# Patient Record
Sex: Female | Born: 1990 | Race: Black or African American | Hispanic: No | Marital: Married | Smoking: Former smoker
Health system: Southern US, Community
[De-identification: ages and names within clinical notes are randomized; demographics above are authoritative.]

## PROBLEM LIST (undated history)

## (undated) DIAGNOSIS — D696 Thrombocytopenia, unspecified: Secondary | ICD-10-CM

## (undated) DIAGNOSIS — Z9289 Personal history of other medical treatment: Secondary | ICD-10-CM

## (undated) DIAGNOSIS — R87619 Unspecified abnormal cytological findings in specimens from cervix uteri: Secondary | ICD-10-CM

## (undated) DIAGNOSIS — N390 Urinary tract infection, site not specified: Secondary | ICD-10-CM

## (undated) DIAGNOSIS — F5089 Other specified eating disorder: Secondary | ICD-10-CM

## (undated) HISTORY — DX: Unspecified abnormal cytological findings in specimens from cervix uteri: R87.619

## (undated) HISTORY — DX: Urinary tract infection, site not specified: N39.0

## (undated) HISTORY — DX: Personal history of other medical treatment: Z92.89

## (undated) HISTORY — PX: COLPOSCOPY: SHX161

## (undated) HISTORY — DX: Other specified eating disorder: F50.89

## (undated) HISTORY — DX: Thrombocytopenia, unspecified: D69.6

---
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
## 2008-10-30 ENCOUNTER — Emergency Department: Payer: Self-pay | Admitting: Emergency Medicine

## 2009-02-20 ENCOUNTER — Emergency Department: Payer: Self-pay | Admitting: Emergency Medicine

## 2009-07-15 ENCOUNTER — Emergency Department: Payer: Self-pay | Admitting: Emergency Medicine

## 2009-09-16 ENCOUNTER — Encounter: Payer: Self-pay | Admitting: Obstetrics and Gynecology

## 2010-03-04 ENCOUNTER — Inpatient Hospital Stay: Payer: Self-pay | Admitting: Obstetrics & Gynecology

## 2010-12-21 ENCOUNTER — Emergency Department: Payer: Self-pay | Admitting: Emergency Medicine

## 2010-12-22 ENCOUNTER — Ambulatory Visit: Payer: Self-pay | Admitting: Emergency Medicine

## 2011-04-01 LAB — HM PAP SMEAR: HM Pap smear: NEGATIVE

## 2011-07-23 ENCOUNTER — Encounter: Payer: Self-pay | Admitting: Maternal and Fetal Medicine

## 2011-08-20 ENCOUNTER — Inpatient Hospital Stay: Payer: Self-pay | Admitting: Obstetrics and Gynecology

## 2011-08-20 LAB — CBC WITH DIFFERENTIAL/PLATELET
Basophil #: 0 10*3/uL (ref 0.0–0.1)
Basophil %: 0.3 %
Eosinophil #: 0.1 10*3/uL (ref 0.0–0.7)
Eosinophil %: 0.5 %
HCT: 32.5 % — ABNORMAL LOW (ref 35.0–47.0)
HGB: 10.9 g/dL — ABNORMAL LOW (ref 12.0–16.0)
Lymphocyte %: 21.6 %
MCHC: 33.7 g/dL (ref 32.0–36.0)
Monocyte #: 0.6 10*3/uL (ref 0.0–0.7)
Monocyte %: 5.6 %
Neutrophil #: 7.2 10*3/uL — ABNORMAL HIGH (ref 1.4–6.5)
Neutrophil %: 72 %
WBC: 10 10*3/uL (ref 3.6–11.0)

## 2014-06-14 ENCOUNTER — Encounter: Payer: Self-pay | Admitting: Obstetrics and Gynecology

## 2014-06-29 NOTE — L&D Delivery Note (Addendum)
Delivery Note At 3:10 PM a viable female was delivered via Vaginal, Spontaneous Delivery (Presentation: Right Occiput Anterior).   APGAR: 9, 9 Weight- 8 # 6 Oz. 3810 g   Infant's name: Anette Riedel    Placenta status: Intact, Spontaneous.   Cord: 3 vessels with the following complications: None.  Anesthesia: None  Episiotomy: None Lacerations: None Est. Blood Loss (mL): 200  Mom to postpartum.  Baby to Couplet care / Skin to Skin.  SVD of a live viable female over an intact perineum after a rapid second stage that resulted in crowning and delivery in 2 pushes. After delivery, the infant was placed on the maternal abdomen where the cord was clamped and cut by the fob after pulsation ceased. Apgars were 9/9. The placenta was then delivered via schultz mechanism and was intact with a 3-vessel cord. Pitocin was then started and the uterus was firm with fundal massage and had minimal bleeding. EBL was ~200. The perineum was inspected and was found to be intact. Mother and baby are bonding well.   Jannet Mantis 12/21/2014, 3:29 PM

## 2014-07-15 ENCOUNTER — Emergency Department: Payer: Self-pay | Admitting: Emergency Medicine

## 2014-07-15 LAB — URINALYSIS, COMPLETE
BACTERIA: NONE SEEN
BILIRUBIN, UR: NEGATIVE
Blood: NEGATIVE
Glucose,UR: NEGATIVE mg/dL (ref 0–75)
LEUKOCYTE ESTERASE: NEGATIVE
NITRITE: NEGATIVE
Ph: 9 (ref 4.5–8.0)
Protein: NEGATIVE
RBC,UR: 1 /HPF (ref 0–5)
Specific Gravity: 1.015 (ref 1.003–1.030)
Squamous Epithelial: 2
WBC UR: 1 /HPF (ref 0–5)

## 2014-07-15 LAB — CBC
HCT: 39.5 % (ref 35.0–47.0)
HGB: 13.5 g/dL (ref 12.0–16.0)
MCH: 31.9 pg (ref 26.0–34.0)
MCHC: 34.1 g/dL (ref 32.0–36.0)
MCV: 94 fL (ref 80–100)
PLATELETS: 128 10*3/uL — AB (ref 150–440)
RBC: 4.22 10*6/uL (ref 3.80–5.20)
RDW: 13.9 % (ref 11.5–14.5)
WBC: 7.8 10*3/uL (ref 3.6–11.0)

## 2014-07-15 LAB — BASIC METABOLIC PANEL
Anion Gap: 8 (ref 7–16)
BUN: 4 mg/dL — AB (ref 7–18)
Calcium, Total: 8.1 mg/dL — ABNORMAL LOW (ref 8.5–10.1)
Chloride: 105 mmol/L (ref 98–107)
Co2: 24 mmol/L (ref 21–32)
Creatinine: 0.63 mg/dL (ref 0.60–1.30)
GLUCOSE: 74 mg/dL (ref 65–99)
OSMOLALITY: 269 (ref 275–301)
Potassium: 4 mmol/L (ref 3.5–5.1)
SODIUM: 137 mmol/L (ref 136–145)

## 2014-07-15 LAB — HCG, QUANTITATIVE, PREGNANCY: BETA HCG, QUANT.: 12107 m[IU]/mL — AB

## 2014-07-29 ENCOUNTER — Encounter
Admit: 2014-07-29 | Disposition: A | Discharge status: Home / Self Care | Payer: Self-pay | Admitting: Obstetrics and Gynecology

## 2014-10-21 NOTE — Consult Note (Signed)
Referral Information:   Reason for Referral Thrombocytopenia    Referring Physician Farrel Connersolleen Gutierrez, CNM    Prenatal Hx 24 year old G3P0202 at 2631 1/7 weeks. Previous uncomplicated antenatal course. Platelet count on 06/16/2011 was 102,000. Before this, patient without history of thrombocytopenia    Past Obstetrical Hx 2009 -- 36 week NSVD of a 6#15oz female 2011 -- 336 week NSVD of a 7#15oz female   Home Medications:  Prenatal Multivitamins oral tablet: 1 tab(s) orally once a day, Active  Allergies:   No Known Allergies:   Vital Signs/Notes:  Nursing Vital Signs: **Vital Signs.:   24-Jan-13 09:27   Vital Signs Type Routine   Temperature Temperature (F) 97.2   Celsius 36.2   Temperature Source oral   Pulse Pulse 90   Pulse source per Dinamap   Respirations Respirations 14   Systolic BP Systolic BP 109   Diastolic BP (mmHg) Diastolic BP (mmHg) 63   Mean BP 78   BP Source Dinamap   Perinatal Consult:   Past Medical History cont'd Surgery -- none Illness -- none Allergies -- none Immunizations -- need influenza vaccine  Soc Hx School -- [employer] Occupation -- Futures traderHomemaker Married x 2 years Smoking -- none EtOH -- none Drugs -- none   Impression/Recommendations:   Impression 1) IUP at 31 1/7 weeks 2) Mild Thrombocytopenia   a) Most likely Gestational Thrombocytopenia   b) Possible (less likely) post viral   c) Possible (less likely) ITP    Recommendations 1) Recheck platelets at 36 weeks' gestation 2) If below value that your anesthesiologist feels comfortable delivering neuroaxial anestheisa, please reconsult 3) Recheck platelet count at labor admission 4) Recheck platelet count at 6 week postpartum check     Total Time Spent with Patient 30 minutes    >50% of visit spent in couseling/coordination of care yes    Office Use Only 99242  Level 2 (30min) NEW office consult exp prob focused   Coding Description: OTHER: Thrombocytopenia.  Electronic  Signatures: Marcelino ScotBrancazio, Lavoy Bernards (MD)  (Signed 24-Jan-13 11:24)  Authored: Referral, Home Medications, Allergies, Vital Signs/Notes, Consult, Impression, Billing, Coding Description   Last Updated: 24-Jan-13 11:24 by Marcelino ScotBrancazio, Issabelle Mcraney (MD)

## 2014-11-06 NOTE — H&P (Signed)
L&D Evaluation:  History:   HPI 24 yo G3P2002 at 8515w1d GA by LMP.  Pregnancy complicated by thrombocytopenia and hemorrhoids.  Seen by [facility] for low platelets.  Gestational thrombocytopenia was thought most likely. She has a history of precipitous delivery (<20 min) after SROM.  Presented to clinic today with cervical exam 6cm (was 4cm at previous visit). Is not feeling contractions. +FM, no VB, no LOF.  Did not receive flu shot this season. O+, RPR NR, RI, HbSAg neg, GBS neg.    Presents with advanced cervical dilitation    Patient's Medical History No Chronic Illness    Patient's Surgical History none    Medications Pre Natal Vitamins    Allergies NKDA    Social History none    Family History Non-Contributory   ROS:   ROS All systems were reviewed.  HEENT, CNS, GI, GU, Respiratory, CV, Renal and Musculoskeletal systems were found to be normal.   Exam:   Vital Signs stable  AF all within normal limits    General no apparent distress    Mental Status clear    Chest clear    Heart normal sinus rhythm    Abdomen gravid, non-tender    Estimated Fetal Weight 7#14    Fetal Position Vtx    Pelvic 6cm per RN    Mebranes Intact    FHT normal rate with no decels    FHT Description 145/mod var/+accels/no decels    Ucx regular, 5-6 Q 10 min   Impression:   Impression advanced cervical dilation at term, history of precipitous labor once SROM   Plan:   Plan EFM/NST, monitor contractions and for cervical change    Comments Will admit and will augment labor.  With her increase in contractions since she has arrived, expectant management at first. Will recheck platelets (entire CBC), check T&S clear liquid diet ok.   Electronic Signatures: Conard NovakJackson, Stephen D (MD)  (Signed 21-Feb-13 17:27)  Authored: L&D Evaluation   Last Updated: 21-Feb-13 17:27 by Conard NovakJackson, Stephen D (MD)

## 2014-12-19 ENCOUNTER — Other Ambulatory Visit
Admission: RE | Admit: 2014-12-19 | Discharge: 2014-12-19 | Disposition: A | Discharge status: Home / Self Care | Payer: Medicaid Other | Source: Ambulatory Visit | Attending: Obstetrics & Gynecology | Admitting: Obstetrics & Gynecology

## 2014-12-19 DIAGNOSIS — D696 Thrombocytopenia, unspecified: Secondary | ICD-10-CM | POA: Diagnosis not present

## 2014-12-19 DIAGNOSIS — O09899 Supervision of other high risk pregnancies, unspecified trimester: Secondary | ICD-10-CM | POA: Insufficient documentation

## 2014-12-19 LAB — CBC
HEMATOCRIT: 35 % (ref 35.0–47.0)
HEMOGLOBIN: 11.6 g/dL — AB (ref 12.0–16.0)
MCH: 30.6 pg (ref 26.0–34.0)
MCHC: 33.1 g/dL (ref 32.0–36.0)
MCV: 92.7 fL (ref 80.0–100.0)
Platelets: 60 10*3/uL — ABNORMAL LOW (ref 150–440)
RBC: 3.78 MIL/uL — ABNORMAL LOW (ref 3.80–5.20)
RDW: 13.4 % (ref 11.5–14.5)
WBC: 6.3 10*3/uL (ref 3.6–11.0)

## 2014-12-19 LAB — TYPE AND SCREEN
ABO/RH(D): O POS
ANTIBODY SCREEN: NEGATIVE

## 2014-12-19 LAB — PLATELET FUNCTION ASSAY
Collagen / ADP: 89 seconds (ref 0–118)
Collagen / Epinephrine: 111 seconds (ref 0–193)

## 2014-12-19 LAB — ABO/RH: ABO/RH(D): O POS

## 2014-12-20 LAB — ANA COMPREHENSIVE PANEL
Centromere Ab Screen: <0.2 AI (ref 0.0–0.9)
Chromatin Ab SerPl-aCnc: <0.2 AI (ref 0.0–0.9)
ENA SM Ab Ser-aCnc: <0.2 AI (ref 0.0–0.9)
SSB (La) (ENA) Antibody, IgG: <0.2 AI (ref 0.0–0.9)
Scleroderma (Scl-70) (ENA) Antibody, IgG: <0.2 AI (ref 0.0–0.9)

## 2014-12-21 ENCOUNTER — Encounter: Payer: Self-pay | Admitting: *Deleted

## 2014-12-21 ENCOUNTER — Inpatient Hospital Stay
Admission: EM | Admit: 2014-12-21 | Discharge: 2014-12-23 | DRG: 775 | Disposition: A | Discharge status: Home / Self Care | Payer: Medicaid Other | Attending: Obstetrics & Gynecology | Admitting: Obstetrics & Gynecology

## 2014-12-21 DIAGNOSIS — Z3490 Encounter for supervision of normal pregnancy, unspecified, unspecified trimester: Secondary | ICD-10-CM

## 2014-12-21 DIAGNOSIS — Z79899 Other long term (current) drug therapy: Secondary | ICD-10-CM

## 2014-12-21 DIAGNOSIS — D696 Thrombocytopenia, unspecified: Secondary | ICD-10-CM | POA: Diagnosis present

## 2014-12-21 DIAGNOSIS — O9912 Other diseases of the blood and blood-forming organs and certain disorders involving the immune mechanism complicating childbirth: Principal | ICD-10-CM | POA: Diagnosis present

## 2014-12-21 DIAGNOSIS — Z87891 Personal history of nicotine dependence: Secondary | ICD-10-CM

## 2014-12-21 DIAGNOSIS — Z809 Family history of malignant neoplasm, unspecified: Secondary | ICD-10-CM | POA: Diagnosis not present

## 2014-12-21 DIAGNOSIS — O99119 Other diseases of the blood and blood-forming organs and certain disorders involving the immune mechanism complicating pregnancy, unspecified trimester: Secondary | ICD-10-CM

## 2014-12-21 DIAGNOSIS — Z3A39 39 weeks gestation of pregnancy: Secondary | ICD-10-CM | POA: Diagnosis present

## 2014-12-21 LAB — PREPARE PLATELET PHERESIS: UNIT DIVISION: 0

## 2014-12-21 LAB — CBC
HCT: 34.6 % — ABNORMAL LOW (ref 35.0–47.0)
HEMATOCRIT: 34.5 % — AB (ref 35.0–47.0)
HEMOGLOBIN: 11.7 g/dL — AB (ref 12.0–16.0)
Hemoglobin: 11.6 g/dL — ABNORMAL LOW (ref 12.0–16.0)
MCH: 31 pg (ref 26.0–34.0)
MCH: 30.8 pg (ref 26.0–34.0)
MCHC: 33.7 g/dL (ref 32.0–36.0)
MCHC: 33.5 g/dL (ref 32.0–36.0)
MCV: 91.8 fL (ref 80.0–100.0)
MCV: 91.9 fL (ref 80.0–100.0)
Platelets: 55 10*3/uL — ABNORMAL LOW (ref 150–440)
Platelets: 76 10*3/uL — ABNORMAL LOW (ref 150–440)
RBC: 3.77 MIL/uL — AB (ref 3.80–5.20)
RBC: 3.75 MIL/uL — AB (ref 3.80–5.20)
RDW: 13.4 % (ref 11.5–14.5)
RDW: 13.5 % (ref 11.5–14.5)
WBC: 6.2 10*3/uL (ref 3.6–11.0)
WBC: 10.4 10*3/uL (ref 3.6–11.0)

## 2014-12-21 LAB — TYPE AND SCREEN
ABO/RH(D): O POS
ANTIBODY SCREEN: NEGATIVE

## 2014-12-21 MED ORDER — SIMETHICONE 80 MG PO CHEW: 80.0000 mg | CHEWABLE_TABLET | ORAL | Status: DC | PRN

## 2014-12-21 MED ORDER — DIPHENHYDRAMINE HCL 25 MG PO CAPS: 25.0000 mg | ORAL_CAPSULE | Freq: Four times a day (QID) | ORAL | Status: DC | PRN

## 2014-12-21 MED ORDER — FERROUS SULFATE 325 (65 FE) MG PO TABS
325.0000 mg | ORAL_TABLET | Freq: Two times a day (BID) | ORAL | Status: DC
  Administered 2014-12-22 – 2014-12-23 (×3): 325 mg via ORAL
  Filled 2014-12-21 (×4): qty 1

## 2014-12-21 MED ORDER — MAGNESIUM HYDROXIDE 400 MG/5ML PO SUSP: 30.0000 mL | ORAL | Status: DC | PRN

## 2014-12-21 MED ORDER — OXYTOCIN 40 UNITS IN LACTATED RINGERS INFUSION - SIMPLE MED: 62.5000 mL/h | Freq: Once | INTRAVENOUS | Status: DC

## 2014-12-21 MED ORDER — IBUPROFEN 600 MG PO TABS
600.0000 mg | ORAL_TABLET | Freq: Four times a day (QID) | ORAL | Status: DC
  Administered 2014-12-21: 600 mg via ORAL
  Filled 2014-12-21: qty 1

## 2014-12-21 MED ORDER — ONDANSETRON HCL 4 MG PO TABS: 4.0000 mg | ORAL_TABLET | ORAL | Status: DC | PRN

## 2014-12-21 MED ORDER — OXYCODONE-ACETAMINOPHEN 5-325 MG PO TABS
1.0000 | ORAL_TABLET | ORAL | Status: DC | PRN
  Administered 2014-12-22 (×2): 1 via ORAL
  Filled 2014-12-21 (×2): qty 1

## 2014-12-21 MED ORDER — METHYLERGONOVINE MALEATE 0.2 MG/ML IJ SOLN
0.2000 mg | INTRAMUSCULAR | Status: DC | PRN
  Filled 2014-12-21: qty 1

## 2014-12-21 MED ORDER — OXYCODONE-ACETAMINOPHEN 5-325 MG PO TABS: 1.0000 | ORAL_TABLET | ORAL | Status: DC | PRN

## 2014-12-21 MED ORDER — OXYTOCIN 40 UNITS IN LACTATED RINGERS INFUSION - SIMPLE MED
1.0000 m[IU]/min | INTRAVENOUS | Status: DC
  Administered 2014-12-21: 1 m[IU]/min via INTRAVENOUS
  Administered 2014-12-21: 666 m[IU]/min via INTRAVENOUS

## 2014-12-21 MED ORDER — OXYCODONE-ACETAMINOPHEN 5-325 MG PO TABS: 2.0000 | ORAL_TABLET | ORAL | Status: DC | PRN

## 2014-12-21 MED ORDER — WITCH HAZEL-GLYCERIN EX PADS
1.0000 "application " | MEDICATED_PAD | CUTANEOUS | Status: DC | PRN
  Administered 2014-12-22: 1 via TOPICAL
  Filled 2014-12-21: qty 100

## 2014-12-21 MED ORDER — TETANUS-DIPHTH-ACELL PERTUSSIS 5-2.5-18.5 LF-MCG/0.5 IM SUSP: 0.5000 mL | Freq: Once | INTRAMUSCULAR | Status: DC

## 2014-12-21 MED ORDER — ONDANSETRON HCL 4 MG/2ML IJ SOLN: 4.0000 mg | INTRAMUSCULAR | Status: DC | PRN

## 2014-12-21 MED ORDER — OXYCODONE-ACETAMINOPHEN 5-325 MG PO TABS
2.0000 | ORAL_TABLET | ORAL | Status: DC | PRN
  Administered 2014-12-23: 2 via ORAL
  Filled 2014-12-21: qty 2

## 2014-12-21 MED ORDER — ACETAMINOPHEN 325 MG PO TABS
650.0000 mg | ORAL_TABLET | ORAL | Status: DC | PRN
  Administered 2014-12-21: 650 mg via ORAL

## 2014-12-21 MED ORDER — LACTATED RINGERS IV SOLN: 500.0000 mL | INTRAVENOUS | Status: DC | PRN

## 2014-12-21 MED ORDER — METHYLERGONOVINE MALEATE 0.2 MG PO TABS: 0.2000 mg | ORAL_TABLET | ORAL | Status: DC | PRN

## 2014-12-21 MED ORDER — LANOLIN HYDROUS EX OINT: TOPICAL_OINTMENT | CUTANEOUS | Status: DC | PRN

## 2014-12-21 MED ORDER — DIBUCAINE 1 % RE OINT: 1.0000 "application " | TOPICAL_OINTMENT | RECTAL | Status: DC | PRN

## 2014-12-21 MED ORDER — OXYTOCIN 40 UNITS IN LACTATED RINGERS INFUSION - SIMPLE MED
INTRAVENOUS | Status: AC
  Administered 2014-12-21: 1 m[IU]/min via INTRAVENOUS
  Filled 2014-12-21: qty 1000

## 2014-12-21 MED ORDER — BENZOCAINE-MENTHOL 20-0.5 % EX AERO: 1.0000 "application " | INHALATION_SPRAY | CUTANEOUS | Status: DC | PRN

## 2014-12-21 MED ORDER — OXYTOCIN BOLUS FROM INFUSION: 500.0000 mL | INTRAVENOUS | Status: DC

## 2014-12-21 MED ORDER — TERBUTALINE SULFATE 1 MG/ML IJ SOLN
0.2500 mg | Freq: Once | INTRAMUSCULAR | Status: DC | PRN
  Filled 2014-12-21: qty 1

## 2014-12-21 MED ORDER — CITRIC ACID-SODIUM CITRATE 334-500 MG/5ML PO SOLN: 30.0000 mL | ORAL | Status: DC | PRN

## 2014-12-21 MED ORDER — LIDOCAINE HCL (PF) 1 % IJ SOLN
30.0000 mL | INTRAMUSCULAR | Status: DC | PRN
  Filled 2014-12-21: qty 30

## 2014-12-21 MED ORDER — ONDANSETRON HCL 4 MG/2ML IJ SOLN: 4.0000 mg | Freq: Four times a day (QID) | INTRAMUSCULAR | Status: DC | PRN

## 2014-12-21 MED ORDER — DOCUSATE SODIUM 100 MG PO CAPS
100.0000 mg | ORAL_CAPSULE | Freq: Two times a day (BID) | ORAL | Status: DC
  Administered 2014-12-21 – 2014-12-23 (×4): 100 mg via ORAL
  Filled 2014-12-21 (×4): qty 1

## 2014-12-21 MED ORDER — LACTATED RINGERS IV SOLN
INTRAVENOUS | Status: DC
  Administered 2014-12-21: 10:00:00 via INTRAVENOUS

## 2014-12-21 MED ORDER — PRENATAL MULTIVITAMIN CH
1.0000 | ORAL_TABLET | Freq: Every day | ORAL | Status: DC
  Administered 2014-12-22 – 2014-12-23 (×2): 1 via ORAL
  Filled 2014-12-21 (×2): qty 1

## 2014-12-21 MED ORDER — ZOLPIDEM TARTRATE 5 MG PO TABS: 5.0000 mg | ORAL_TABLET | Freq: Every evening | ORAL | Status: DC | PRN

## 2014-12-21 MED ORDER — OXYTOCIN 40 UNITS IN LACTATED RINGERS INFUSION - SIMPLE MED
62.5000 mL/h | INTRAVENOUS | Status: DC
  Administered 2014-12-21 (×2): 62.5 mL/h via INTRAVENOUS
  Filled 2014-12-21: qty 1000

## 2014-12-21 MED ORDER — ACETAMINOPHEN 325 MG PO TABS: 650.0000 mg | ORAL_TABLET | ORAL | Status: DC | PRN

## 2014-12-21 MED ORDER — ACETAMINOPHEN 325 MG PO TABS
ORAL_TABLET | ORAL | Status: AC
  Administered 2014-12-21: 650 mg via ORAL
  Filled 2014-12-21: qty 2

## 2014-12-21 NOTE — Discharge Summary (Signed)
  Obstetrical Discharge Summary  Date of Admission: 12/21/2014 Date of Discharge: 12/23/2014  Primary OB: [facility]  Gestational Age at Delivery: [redacted]w[redacted]d   Antepartum complications: gestational thrombocytopenia with a platelet count of 55 at delivery Reason for Admission: IOL for thrombocytopenia  Date of Delivery: 12/23/2014  Delivered By: Alric Quan, CNM Delivery Type: spontaneous vaginal delivery Intrapartum complications/course: None Anesthesia: none Placenta: spontaneous Laceration: none  Episiotomy: none Baby: Liveborn female, APGARs9/9, weight pending  3810 g 8# 6 oz. .   Post partum course: Pt with gestation thrombocytopenia. Pt with stable platelet count during the postpartum period. Pt stable and ready for discharge on PPD 2.  Disposition: Home  Rh Immune globulin given: not applicable Rubella vaccine given: not applicable Tdap vaccine given in AP or PP setting: yes Flu vaccine given in AP or PP setting: not applicable  Contraception: Pt plans IUD vs. Vasectomy   Prenatal/Postnatal Panel: O POS//Rubella Immune//RPR negative//HIV negative/HepB Surface Ag negative///plans to breastfeed  Plan:  Carolyn Chavez was discharged to home in good condition. Follow-up appointment with [facility] in 6 weeks  No future appointments.  Discharge Medications:   Medication List    TAKE these medications        acetaminophen 500 MG tablet  Commonly known as:  TYLENOL  Take 2 tablets (1,000 mg total) by mouth every 6 (six) hours as needed for moderate pain.     oxyCODONE 5 MG immediate release tablet  Commonly known as:  ROXICODONE  Take 1-2 tablets (5-10 mg total) by mouth every 4 (four) hours as needed for severe pain.     prenatal multivitamin Tabs tablet  Take 1 tablet by mouth daily at 12 noon.        Allena Napoleon OBGYN

## 2014-12-21 NOTE — H&P (Signed)
Late entry from 11:00  OB History & Physical   History of Present Illness:  Chief Complaint:  Term pregnancy with thrombocytopenia  HPI:  Carolyn Chavez is a 24 y.o. [phone] female at [redacted]w[redacted]d dated by LMP with EDC of 12/27/14.  Her pregnancy has been complicated by severe thrombocytopenia, with platelets prior to admission of 60, history of precipitous deliveries x3, and transfer of care at 20 weeks..  She presents to L&D for scheduled induction of labor due to thrombocytopenia.    +FM, no CTX, no LOF, no VB  Maternal Medical History:  History reviewed. No pertinent past medical history.  History reviewed. No pertinent past surgical history.  No Known Allergies  Prior to Admission medications   Medication Sig Start Date End Date Taking? Authorizing Provider  Prenatal Vit-Fe Fumarate-FA (PRENATAL MULTIVITAMIN) TABS tablet Take 1 tablet by mouth daily at 12 noon.   Yes Historical Provider, MD     Prenatal care site: Carolyn Chavez, [facility]   Social History: She  reports that she quit smoking about 9 months ago. She does not have any smokeless tobacco history on file. She reports that she does not drink alcohol or use illicit drugs.  Family History: family history includes Cancer in her maternal grandfather.   Review of Systems: Negative x 10 systems reviewed except as noted in the HPI.     Physical Exam:  Vital Signs: BP 107/63 mmHg  Pulse 69  Temp(Src) 98 F (36.7 C) (Oral)  Resp 18  Ht  (1.676 m)  Wt 73.029 kg (161 lb)  BMI 26.00 kg/m2  SpO2 97%  LMP 03/22/2014  Breastfeeding? Unknown General: no acute distress.  HEENT: normocephalic, atraumatic Heart: regular rate & rhythm.  No murmurs/rubs/gallops Lungs: clear to auscultation bilaterally, normal respiratory effort Abdomen: soft, gravid, non-tender;  EFW: 8lbs Pelvic:   External: Normal external female genitalia  Cervix: Dilation: 6 / Effacement (%): 70 / Station: -1    Extremities:  non-tender, symmetric, no edema bilaterally.  DTRs: 2+ Neurologic: Alert & oriented x 3.    Pertinent Results:  Prenatal Labs: Blood type/Rh O+  Antibody screen neg  Rubella neg  RPR NR  HBsAg neg  HIV neg  GC neg  Chlamydia neg  Genetic screening neg  1 hour GTT 97  3 hour GTT n/a  GBS neg   FHT: 125 mod +accels no decels TOCO: irregular SVE: 6/70/-1 AROM'd for clear fluid SSE: not done  Results for orders placed or performed during the hospital encounter of 12/21/14 (from the past 24 hour(s))  Prepare platelet pheresis     Status: None   Collection Time: 12/21/14 10:30 AM  Result Value Ref Range   Unit Number [id number]    Blood Component Type      PLTPHER LRI1 Performed at [facility]    Unit division 00    Status of Unit REL FROM [facility]    Transfusion Status OK TO TRANSFUSE   CBC     Status: Abnormal   Collection Time: 12/21/14 10:37 AM  Result Value Ref Range   WBC 6.2 3.6 - 11.0 K/uL   RBC 3.77 (L) 3.80 - 5.20 MIL/uL   Hemoglobin 11.7 (L) 12.0 - 16.0 g/dL   HCT 29.5 (L) 62.1 - 30.8 %   MCV 91.8 80.0 - 100.0 fL   MCH 31.0 26.0 - 34.0 pg   MCHC 33.7 32.0 - 36.0 g/dL   RDW 65.7 84.6 - 96.2 %   Platelets  55 (L) 150 - 440 K/uL  Type and screen     Status: None   Collection Time: 12/21/14 10:37 AM  Result Value Ref Range   ABO/RH(D) O POS    Antibody Screen NEG    Sample Expiration 12/24/2014   Prepare Pheresed Platelets     Status: None (Preliminary result)   Collection Time: 12/21/14 11:08 AM      Assessment:  Carolyn Chavez is a 24 y.o. G7P3003 female at [redacted]w[redacted]d with thrombocytopenia at term and history of precipitous labor.   Plan:  1. Admit to Labor & Delivery - notify anesthesia.   2.   Clrs, IVF 2. Continuous toco/efm   3. Consents obtained. 4. AROM performed.  Will await spontaneous labor, or augment if does not occur in the next few hours.  [facility] provider to be available in house until delivery due to history of rapid  delivieries and platelet status -risk for hemorrhage. 5. Platelets were ordered 2 days ago to be in house - confirmed.  Transfuse order held.  Julez Huseby C  12/21/2014 10:01 PM

## 2014-12-22 LAB — CBC
HCT: 33.4 % — ABNORMAL LOW (ref 35.0–47.0)
Hemoglobin: 11.4 g/dL — ABNORMAL LOW (ref 12.0–16.0)
MCH: 31.3 pg (ref 26.0–34.0)
MCHC: 34 g/dL (ref 32.0–36.0)
MCV: 91.9 fL (ref 80.0–100.0)
Platelets: 66 10*3/uL — ABNORMAL LOW (ref 150–440)
RBC: 3.63 MIL/uL — ABNORMAL LOW (ref 3.80–5.20)
RDW: 13.5 % (ref 11.5–14.5)
WBC: 7.9 10*3/uL (ref 3.6–11.0)

## 2014-12-22 LAB — RPR: RPR Ser Ql: NONREACTIVE

## 2014-12-22 MED ORDER — SODIUM CHLORIDE 0.9 % IJ SOLN
3.0000 mL | Freq: Three times a day (TID) | INTRAMUSCULAR | Status: DC
  Administered 2014-12-22 – 2014-12-23 (×2): 3 mL via INTRAVENOUS

## 2014-12-22 NOTE — Progress Notes (Addendum)
Post Partum Day 1 from an svd  Subjective: no complaints, up ad lib, voiding and tolerating PO Bleeding well controlled.   Objective: Today's Vitals   12/22/14 0030 12/22/14 0440 12/22/14 0738 12/22/14 0800  BP: 116/63 109/79 119/67   Pulse: 61 61 76   Temp: 98 F (36.7 C) 98.1 F (36.7 C) 98.1 F (36.7 C)   TempSrc: Oral Oral Oral   Resp: 18 18 18    Height:      Weight:      SpO2: 100% 98% 100%   PainSc:    0-No pain   Physical Exam:  General: alert, cooperative and no distress Lochia: appropriate Uterine Fundus: firm Incision: NA DVT Evaluation: No evidence of DVT seen on physical exam.   Recent Labs  12/21/14 1846 12/22/14 0437  HGB 11.6* 11.4*  HCT 34.5* 33.4*   CBC Latest Ref Rng 12/22/2014 12/21/2014 12/21/2014  WBC 3.6 - 11.0 K/uL 7.9 10.4 6.2  Hemoglobin 12.0 - 16.0 g/dL 11.4(L) 11.6(L) 11.7(L)  Hematocrit 35.0 - 47.0 % 33.4(L) 34.5(L) 34.6(L)  Platelets 150 - 440 K/uL 66(L) 76(L) 55(L)     Assessment/Plan: Plan for discharge tomorrow and Breastfeeding  Will recheck CBC in am for plt count Keep saline lock in place Platelets on hold in the blood bank F/U 6 weeks postpartum  Plans vasectomy vs IUD for contraception Tdap upon discharge   LOS: 1 day   Carolyn Chavez,  Toni Amend 12/22/2014, 12:06 PM

## 2014-12-23 DIAGNOSIS — O99119 Other diseases of the blood and blood-forming organs and certain disorders involving the immune mechanism complicating pregnancy, unspecified trimester: Secondary | ICD-10-CM

## 2014-12-23 DIAGNOSIS — D696 Thrombocytopenia, unspecified: Secondary | ICD-10-CM | POA: Diagnosis present

## 2014-12-23 LAB — CBC
HCT: 32.4 % — ABNORMAL LOW (ref 35.0–47.0)
HEMOGLOBIN: 11.1 g/dL — AB (ref 12.0–16.0)
MCH: 31.7 pg (ref 26.0–34.0)
MCHC: 34.2 g/dL (ref 32.0–36.0)
MCV: 92.5 fL (ref 80.0–100.0)
Platelets: 68 10*3/uL — ABNORMAL LOW (ref 150–440)
RBC: 3.51 MIL/uL — AB (ref 3.80–5.20)
RDW: 13.4 % (ref 11.5–14.5)
WBC: 6.5 10*3/uL (ref 3.6–11.0)

## 2014-12-23 LAB — PREPARE PLATELET PHERESIS: UNIT DIVISION: 0

## 2014-12-23 MED ORDER — ACETAMINOPHEN 500 MG PO TABS: 1000.0000 mg | ORAL_TABLET | Freq: Four times a day (QID) | ORAL | Status: DC | PRN

## 2014-12-23 MED ORDER — OXYCODONE HCL 5 MG PO TABS: 5.0000 mg | ORAL_TABLET | ORAL | Status: DC | PRN

## 2014-12-23 NOTE — Progress Notes (Signed)
Patient understands all discharge instructions and the need to make follow up appointments. Patient discharge via wheelchair with auxillary. 

## 2014-12-23 NOTE — Treatment Plan (Signed)
To whom is may concern:                 Please excuse Carolyn Chavez from work for the next week. The patients wife has been in the hospital since 12/21/14 at 9 am until 12/23/14 at 12 pm. Carolyn Chavez needs to be allowed to have time off from work to be able to have bonding with his child, as well as assist his wife who is recovering from delivery. If you have any questions please call us at [phone].   Thank you,       Jannet Mantis, CNM
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
## 2017-05-11 ENCOUNTER — Telehealth: Payer: Self-pay

## 2017-05-11 NOTE — Telephone Encounter (Signed)
Pt states she has her NOB apt on 05/25/17 & is inquiring if she can have a first trimester U/S to verify her dates. [phone]

## 2017-05-12 NOTE — Telephone Encounter (Signed)
Attempted to reach pt unable to leave voicemail. Phone numbers are in correct and disconnected

## 2017-05-12 NOTE — Telephone Encounter (Signed)
Yes, it is fine to schedule it with her NOB, but for the sake of expediency it would be good to have the US scheduled prior to her appointment time on that day.

## 2017-05-17 ENCOUNTER — Telehealth: Payer: Self-pay

## 2017-05-17 NOTE — Telephone Encounter (Signed)
Pt is schedule for Dating on 05/24/17 and her NOB is on 11/27 withJC. Please place Ultrasound order.

## 2017-05-17 NOTE — Telephone Encounter (Signed)
Pt states she called last week and no one called her back. Wants 1st trim u/s done as she is unsure of LMP, 5th preg - lot of sxs she hasn't had before. [phone] Left detailed msg to call and sched u/s as there is a verbal order in last msg from provider.

## 2017-05-18 ENCOUNTER — Other Ambulatory Visit: Payer: Self-pay | Admitting: Maternal Newborn

## 2017-05-18 DIAGNOSIS — Z3480 Encounter for supervision of other normal pregnancy, unspecified trimester: Secondary | ICD-10-CM

## 2017-05-18 NOTE — Telephone Encounter (Signed)
Ordered in Epic for 11/26. Thanks, [place] BingJaci

## 2017-05-24 ENCOUNTER — Ambulatory Visit (INDEPENDENT_AMBULATORY_CARE_PROVIDER_SITE_OTHER): Payer: Medicaid Other

## 2017-05-24 DIAGNOSIS — Z3480 Encounter for supervision of other normal pregnancy, unspecified trimester: Secondary | ICD-10-CM | POA: Diagnosis not present

## 2017-05-24 DIAGNOSIS — Z362 Encounter for other antenatal screening follow-up: Secondary | ICD-10-CM

## 2017-05-25 ENCOUNTER — Encounter: Payer: Self-pay | Admitting: Maternal Newborn

## 2017-05-25 ENCOUNTER — Ambulatory Visit (INDEPENDENT_AMBULATORY_CARE_PROVIDER_SITE_OTHER): Payer: Medicaid Other | Admitting: Maternal Newborn

## 2017-05-25 VITALS — BP 100/70 | Wt 131.0 lb

## 2017-05-25 DIAGNOSIS — O0990 Supervision of high risk pregnancy, unspecified, unspecified trimester: Secondary | ICD-10-CM | POA: Insufficient documentation

## 2017-05-25 DIAGNOSIS — Z3480 Encounter for supervision of other normal pregnancy, unspecified trimester: Secondary | ICD-10-CM

## 2017-05-25 HISTORY — DX: Supervision of high risk pregnancy, unspecified, unspecified trimester: O09.90

## 2017-05-25 NOTE — Patient Instructions (Signed)
First Trimester of Pregnancy The first trimester of pregnancy is from week 1 until the end of week 13 (months 1 through 3). A week after a sperm fertilizes an egg, the egg will implant on the wall of the uterus. This embryo will begin to develop into a baby. Genes from you and your partner will form the baby. The female genes will determine whether the baby will be a boy or a girl. At 6-8 weeks, the eyes and face will be formed, and the heartbeat can be seen on ultrasound. At the end of 12 weeks, all the baby's organs will be formed. Now that you are pregnant, you will want to do everything you can to have a healthy baby. Two of the most important things are to get good prenatal care and to follow your health care provider's instructions. Prenatal care is all the medical care you receive before the baby's birth. This care will help prevent, find, and treat any problems during the pregnancy and childbirth. Body changes during your first trimester Your body goes through many changes during pregnancy. The changes vary from woman to woman.  You may gain or lose a couple of pounds at first.  You may feel sick to your stomach (nauseous) and you may throw up (vomit). If the vomiting is uncontrollable, call your health care provider.  You may tire easily.  You may develop headaches that can be relieved by medicines. All medicines should be approved by your health care provider.  You may urinate more often. Painful urination may mean you have a bladder infection.  You may develop heartburn as a result of your pregnancy.  You may develop constipation because certain hormones are causing the muscles that push stool through your intestines to slow down.  You may develop hemorrhoids or swollen veins (varicose veins).  Your breasts may begin to grow larger and become tender. Your nipples may stick out more, and the tissue that surrounds them (areola) may become darker.  Your gums may bleed and may be  sensitive to brushing and flossing.  Dark spots or blotches (chloasma, mask of pregnancy) may develop on your face. This will likely fade after the baby is born.  Your menstrual periods will stop.  You may have a loss of appetite.  You may develop cravings for certain kinds of food.  You may have changes in your emotions from day to day, such as being excited to be pregnant or being concerned that something may go wrong with the pregnancy and baby.  You may have more vivid and strange dreams.  You may have changes in your hair. These can include thickening of your hair, rapid growth, and changes in texture. Some women also have hair loss during or after pregnancy, or hair that feels dry or thin. Your hair will most likely return to normal after your baby is born.  What to expect at prenatal visits During a routine prenatal visit:  You will be weighed to make sure you and the baby are growing normally.  Your blood pressure will be taken.  Your abdomen will be measured to track your baby's growth.  The fetal heartbeat will be listened to between weeks 10 and 14 of your pregnancy.  Test results from any previous visits will be discussed.  Your health care provider may ask you:  How you are feeling.  If you are feeling the baby move.  If you have had any abnormal symptoms, such as leaking fluid, bleeding, severe headaches,   or abdominal cramping.  If you are using any tobacco products, including cigarettes, chewing tobacco, and electronic cigarettes.  If you have any questions.  Other tests that may be performed during your first trimester include:  Blood tests to find your blood type and to check for the presence of any previous infections. The tests will also be used to check for low iron levels (anemia) and protein on red blood cells (Rh antibodies). Depending on your risk factors, or if you previously had diabetes during pregnancy, you may have tests to check for high blood  sugar that affects pregnant women (gestational diabetes).  Urine tests to check for infections, diabetes, or protein in the urine.  An ultrasound to confirm the proper growth and development of the baby.  Fetal screens for spinal cord problems (spina bifida) and Down syndrome.  HIV (human immunodeficiency virus) testing. Routine prenatal testing includes screening for HIV, unless you choose not to have this test.  You may need other tests to make sure you and the baby are doing well.  Follow these instructions at home: Medicines  Follow your health care provider's instructions regarding medicine use. Specific medicines may be either safe or unsafe to take during pregnancy.  Take a prenatal vitamin that contains at least 600 micrograms (mcg) of folic acid.  If you develop constipation, try taking a stool softener if your health care provider approves. Eating and drinking  Eat a balanced diet that includes fresh fruits and vegetables, whole grains, good sources of protein such as meat, eggs, or tofu, and low-fat dairy. Your health care provider will help you determine the amount of weight gain that is right for you.  Avoid raw meat and uncooked cheese. These carry germs that can cause birth defects in the baby.  Eating four or five small meals rather than three large meals a day may help relieve nausea and vomiting. If you start to feel nauseous, eating a few soda crackers can be helpful. Drinking liquids between meals, instead of during meals, also seems to help ease nausea and vomiting.  Limit foods that are high in fat and processed sugars, such as fried and sweet foods.  To prevent constipation: ? Eat foods that are high in fiber, such as fresh fruits and vegetables, whole grains, and beans. ? Drink enough fluid to keep your urine clear or pale yellow. Activity  Exercise only as directed by your health care provider. Most women can continue their usual exercise routine during  pregnancy. Try to exercise for 30 minutes at least 5 days a week. Exercising will help you: ? Control your weight. ? Stay in shape. ? Be prepared for labor and delivery.  Experiencing pain or cramping in the lower abdomen or lower back is a good sign that you should stop exercising. Check with your health care provider before continuing with normal exercises.  Try to avoid standing for long periods of time. Move your legs often if you must stand in one place for a long time.  Avoid heavy lifting.  Wear low-heeled shoes and practice good posture.  You may continue to have sex unless your health care provider tells you not to. Relieving pain and discomfort  Wear a good support bra to relieve breast tenderness.  Take warm sitz baths to soothe any pain or discomfort caused by hemorrhoids. Use hemorrhoid cream if your health care provider approves.  Rest with your legs elevated if you have leg cramps or low back pain.  If you develop   varicose veins in your legs, wear support hose. Elevate your feet for 15 minutes, 3-4 times a day. Limit salt in your diet. Prenatal care  Schedule your prenatal visits by the twelfth week of pregnancy. They are usually scheduled monthly at first, then more often in the last 2 months before delivery.  Write down your questions. Take them to your prenatal visits.  Keep all your prenatal visits as told by your health care provider. This is important. Safety  Wear your seat belt at all times when driving.  Make a list of emergency phone numbers, including numbers for family, friends, the hospital, and police and fire departments. General instructions  Ask your health care provider for a referral to a local prenatal education class. Begin classes no later than the beginning of month 6 of your pregnancy.  Ask for help if you have counseling or nutritional needs during pregnancy. Your health care provider can offer advice or refer you to specialists for help  with various needs.  Do not use hot tubs, steam rooms, or saunas.  Do not douche or use tampons or scented sanitary pads.  Do not cross your legs for long periods of time.  Avoid cat litter boxes and soil used by cats. These carry germs that can cause birth defects in the baby and possibly loss of the fetus by miscarriage or stillbirth.  Avoid all smoking, herbs, alcohol, and medicines not prescribed by your health care provider. Chemicals in these products affect the formation and growth of the baby.  Do not use any products that contain nicotine or tobacco, such as cigarettes and e-cigarettes. If you need help quitting, ask your health care provider. You may receive counseling support and other resources to help you quit.  Schedule a dentist appointment. At home, brush your teeth with a soft toothbrush and be gentle when you floss. Contact a health care provider if:  You have dizziness.  You have mild pelvic cramps, pelvic pressure, or nagging pain in the abdominal area.  You have persistent nausea, vomiting, or diarrhea.  You have a bad smelling vaginal discharge.  You have pain when you urinate.  You notice increased swelling in your face, hands, legs, or ankles.  You are exposed to fifth disease or chickenpox.  You are exposed to German measles (rubella) and have never had it. Get help right away if:  You have a fever.  You are leaking fluid from your vagina.  You have spotting or bleeding from your vagina.  You have severe abdominal cramping or pain.  You have rapid weight gain or loss.  You vomit blood or material that looks like coffee grounds.  You develop a severe headache.  You have shortness of breath.  You have any kind of trauma, such as from a fall or a car accident. Summary  The first trimester of pregnancy is from week 1 until the end of week 13 (months 1 through 3).  Your body goes through many changes during pregnancy. The changes vary from  woman to woman.  You will have routine prenatal visits. During those visits, your health care provider will examine you, discuss any test results you may have, and talk with you about how you are feeling. This information is not intended to replace advice given to you by your health care provider. Make sure you discuss any questions you have with your health care provider. Document Released: 06/09/2001 Document Revised: 05/27/2016 Document Reviewed: 05/27/2016 Elsevier Interactive Patient Education  2017 Elsevier   Inc.  

## 2017-05-25 NOTE — Progress Notes (Signed)
C/O Tired a lot, hungry all the time, headaches. rj

## 2017-05-25 NOTE — Progress Notes (Addendum)
05/25/2017   Chief Complaint: Amenorrhea, positive home pregnancy test, desires prenatal care.  Transfer of Care Patient: no  History of Present Illness: Ms. Carolyn Chavez is a 26 y.o. [id number] 3341w4d based on Patient's last menstrual period on 03/19/2017 (approximate), with an Estimated Date of Delivery: 12/24/17, with the above CC.   Her periods were: regular periods every 28 days. She was using no method when she conceived.  She has Positive signs or symptoms of nausea/vomiting of pregnancy. She has Negative signs or symptoms of miscarriage or preterm labor. She identifies Negative Zika risk factors for her and her partner. On any different medications around the time she conceived/early pregnancy: No.  History of varicella: Yes.   ROS: A 12-point review of systems was performed and negative, except as stated in the above HPI.  OBGYN History: As per HPI. OB History  Gravida Para Term Preterm AB Living  5 4 4     4   SAB TAB Ectopic Multiple Live Births        0 4    # Outcome Date GA Lbr Len/2nd Weight Sex Delivery Anes PTL Lv  5 Current           4 Term 12/21/14 1756w1d 02:18 8 lb 6.4 oz (3.81 kg) M Vag-Spont None  LIV     Birth Comments: THROMBOCYTOPENIA  3 Term 08/20/11 1815w0d  8 lb (3.629 kg) M Vag-Spont  N LIV     Birth Comments: PRECIPITOUS  2 Term 03/05/10 3615w0d  7 lb 15 oz (3.6 kg) F Vag-Spont None N LIV     Birth Comments: PRECIPITOUS  1 Term 10/03/07 2015w0d  6 lb 15 oz (3.147 kg) F Vag-Spont None N LIV      Any issues with any prior pregnancies: Yes, gestational thrombocytopenia with last pregnancy in 2016. Any prior children are healthy, doing well, without any problems or issues: Yes History of pap smears: Yes. Last pap smear 04/01/2011. Abnormal: no  History of STIs: No   Past Medical History: Past Medical History:  Diagnosis Date  . Abnormal Pap smear of cervix    age 26  . History of Papanicolaou smear of cervix [id number]; 04/01/2011   neg; neg, ct/gc neg;   .  Pica    craves flour  . Thrombocytopenia (HCC)   . UTI (urinary tract infection)     Past Surgical History: Past Surgical History:  Procedure Laterality Date  . COLPOSCOPY      Family History:  Family History  Problem Relation Age of Onset  . Cancer Maternal Grandfather 60       lung, brain  . Hypertension Maternal Grandfather   . Schizophrenia Brother   . Hearing loss Daughter        2/16; oldest daughter; low freq loss   She denies any female cancers, bleeding or blood clotting disorders.  She denies any history of intellectual disability, birth defects or genetic disorders in her or the FOB's history.  Social History:  Social History   Socioeconomic History  . Marital status: Married    Spouse name: Not on file  . Number of children: 4  . Years of education: 8012  . Highest education level: Not on file  Social Needs  . Financial resource strain: Not on file  . Food insecurity - worry: Not on file  . Food insecurity - inability: Not on file  . Transportation needs - medical: Not on file  . Transportation needs - non-medical: Not on file  Occupational History  .  Not on file  Tobacco Use  . Smoking status: Former Smoker    Last attempt to quit: 03/22/2014    Years since quitting: 3.1  . Smokeless tobacco: Never Used  Substance and Sexual Activity  . Alcohol use: Yes  . Drug use: No  . Sexual activity: Yes    Birth control/protection: Other-see comments  Other Topics Concern  . Not on file  Social History Narrative  . Not on file   Any cats in the household: no Denies history of and current domestic violence.  Allergy: No Known Allergies  Current Outpatient Medications:  Current Outpatient Medications:  .  Prenatal Vit-Fe Fumarate-FA (PRENATAL MULTIVITAMIN) TABS tablet, Take 1 tablet by mouth daily at 12 noon., Disp: , Rfl:    Physical Exam:   BP 100/70   Wt 131 lb (59.4 kg)   LMP 03/19/2017 (Approximate)   BMI 21.14 kg/m  Body mass index is  21.14 kg/m. Constitutional: Well nourished, well developed female in no acute distress.  Neck:  Supple, normal appearance, and no thyromegaly  Cardiovascular: S1, S2 normal, no murmur, rub or gallop, regular rate and rhythm Respiratory:  Clear to auscultation bilaterally. Normal respiratory effort Abdomen: positive bowel sounds and no masses, hernias; diffusely non tender to palpation, non distended Breasts: breasts appear normal, no suspicious masses, no skin or nipple changes or axillary nodes, patient declines to have breast exam. Neuro/Psych:  Normal mood and affect.  Skin:  Warm and dry.  Lymphatic:  No inguinal lymphadenopathy.   Pelvic exam: is not limited by body habitus External genitalia, Bartholin's glands, Urethra, Skene's glands: within normal limits Vagina: within normal limits and with no blood in the vault  Cervix: normal appearing cervix without discharge or lesions, closed/long/high Uterus:  enlarged: consistent with early pregnancy Adnexa:  normal adnexa and no mass, fullness, tenderness  Assessment: Ms. Carolyn Chavez is a 26 y.o. [id number] 4679w4d based on Patient's last menstrual period was 03/19/2017 (approximate). with an Estimated Date of Delivery: 12/24/17, presenting for prenatal care.  Plan:  1) Avoid alcoholic beverages. 2) Patient encouraged not to smoke. Recently quit and not currently having problems avoiding tobacco. 3) Discontinue the use of all non-medicinal drugs and chemicals.  4) Take prenatal vitamins daily.  5) Seatbelt use advised 6) Nutrition, food safety (fish, cheese advisories, and high nitrite foods) and exercise discussed. 7) Hospital and practice style delivering at [facility] discussed  8) Patient is asked about travel to areas at risk for the Zika virus, and counseled to avoid travel and exposure to mosquitoes or sexual partners who may have themselves been exposed to the virus. Testing is discussed, and will be ordered as appropriate.  9) [facility]birth  classes at [facility] advised 10) Genetic Screening, such as with 1st Trimester Screening, cell free fetal DNA, AFP testing, and Ultrasound, as well as with amniocentesis and CVS as appropriate, is discussed with patient. She plans to have genetic testing this pregnancy. 11) Ultrasound on 11/26 showed a singleton IUP with FHR 176.  Problem list reviewed and updated.  Return in about 3 weeks (around 06/15/2017) for ROB and first trimester screen.  Marcelyn BruinsJacelyn Schmid, CNM [facility], [facility] 05/25/2017  9:55 AM

## 2017-05-26 LAB — HEMOGLOBINOPATHY EVALUATION
HEMOGLOBIN A2 QUANTITATION: 2.1 % (ref 1.8–3.2)
HGB A: 97.9 % (ref 96.4–98.8)
HGB C: 0 %
HGB S: 0 %
HGB VARIANT: 0 %
Hemoglobin F Quantitation: 0 % (ref 0.0–2.0)

## 2017-05-26 LAB — RPR+RH+ABO+RUB AB+AB SCR+CB...
ANTIBODY SCREEN: NEGATIVE
HEP B S AG: NEGATIVE
HIV Screen 4th Generation wRfx: NONREACTIVE
Hematocrit: 37.5 % (ref 34.0–46.6)
Hemoglobin: 11.7 g/dL (ref 11.1–15.9)
MCH: 26.5 pg — ABNORMAL LOW (ref 26.6–33.0)
MCHC: 31.2 g/dL — ABNORMAL LOW (ref 31.5–35.7)
MCV: 85 fL (ref 79–97)
Platelets: 174 10*3/uL (ref 150–379)
RBC: 4.42 x10E6/uL (ref 3.77–5.28)
RDW: 20 % — AB (ref 12.3–15.4)
RH TYPE: POSITIVE
RPR: NONREACTIVE
Rubella Antibodies, IGG: 3.55 index (ref >0.99)
Varicella zoster IgG: 1451 index (ref >165)
WBC: 5.7 10*3/uL (ref 3.4–10.8)

## 2017-05-27 LAB — PAP LB, CT-NG, RFX HPV ASCU
CHLAMYDIA, NUC. ACID AMP: NEGATIVE
Gonococcus, Nuc. Acid Amp: NEGATIVE
PAP Smear Comment: 0

## 2017-05-27 LAB — URINE DRUG PANEL 7
Amphetamines, Urine: NEGATIVE ng/mL
BARBITURATE QUANT UR: NEGATIVE ng/mL
BENZODIAZEPINE QUANT UR: NEGATIVE ng/mL
CANNABINOID QUANT UR: NEGATIVE ng/mL
Cocaine (Metab.): NEGATIVE ng/mL
Opiate Quant, Ur: NEGATIVE ng/mL
PCP Quant, Ur: NEGATIVE ng/mL

## 2017-05-27 LAB — URINE CULTURE

## 2017-06-15 ENCOUNTER — Ambulatory Visit (INDEPENDENT_AMBULATORY_CARE_PROVIDER_SITE_OTHER): Payer: Medicaid Other | Admitting: Maternal Newborn

## 2017-06-15 ENCOUNTER — Ambulatory Visit (INDEPENDENT_AMBULATORY_CARE_PROVIDER_SITE_OTHER): Payer: Medicaid Other

## 2017-06-15 ENCOUNTER — Encounter: Payer: Self-pay | Admitting: Maternal Newborn

## 2017-06-15 VITALS — BP 110/64 | Wt 133.0 lb

## 2017-06-15 DIAGNOSIS — Z3480 Encounter for supervision of other normal pregnancy, unspecified trimester: Secondary | ICD-10-CM

## 2017-06-15 DIAGNOSIS — Z362 Encounter for other antenatal screening follow-up: Secondary | ICD-10-CM

## 2017-06-15 NOTE — Progress Notes (Signed)
No concerns.rj 

## 2017-06-15 NOTE — Progress Notes (Addendum)
    Routine Prenatal Care Visit  Subjective  Carolyn Chavez is a 26 y.o. G5P4004 at 3014w4d being seen today for ongoing prenatal care.  She is currently monitored for the following issues for this low-risk pregnancy and has Supervision of other normal pregnancy, antepartum on her problem list.  ----------------------------------------------------------------------------------- Patient reports some occasional uterine cramping.  Nausea is improving. Vag. Bleeding: None.  Denies leaking of fluid.  ----------------------------------------------------------------------------------- The following portions of the patient's history were reviewed and updated as appropriate: allergies, current medications, past family history, past medical history, past social history, past surgical history and problem list. Problem list updated.   Objective  Blood pressure 110/64, weight 133 lb (60.3 kg), last menstrual period 03/19/2017, unknown if currently breastfeeding. Pregravid weight 126 lb (57.2 kg) Total Weight Gain 7 lb (3.175 kg) Urinalysis: Urine Protein: Negative Urine Glucose: Negative  Fetal Status: Fetal Heart Rate (bpm): 162         General:  Alert, oriented and cooperative. Patient is in no acute distress.  Skin: Skin is warm and dry. No rash noted.   Cardiovascular: Normal heart rate noted  Respiratory: Normal respiratory effort, no problems with respiration noted  Abdomen: Soft, gravid, appropriate for gestational age.       Pelvic:  Cervical exam deferred        Extremities: Normal range of motion.     Mental Status: Normal mood and affect. Normal behavior. Normal judgment and thought content.     Assessment   26 y.o. [id number] at 7014w4d, EDD 12/24/2017 by Last Menstrual Period presenting for routine prenatal visit.  Plan   FIFTH Problems (from 05/25/17 to present)    Problem Noted Resolved   Supervision of other normal pregnancy, antepartum 05/25/2017 by Oswaldo ConroySchmid, Yukio Bisping Y, CNM No   Overview Addendum 06/15/2017  9:39 AM by Oswaldo ConroySchmid, Marshella Tello Y, CNM    Clinic [facility] Prenatal Labs  Dating  Blood type: O/Positive/-- (11/27 1039)   Genetic Screen 1 Screen:    AFP:     Quad:     NIPS: Antibody:Negative (11/27 1039)  Anatomic US  Rubella: 3.55 (11/27 1039) Varicella:    GTT Early:               Third trimester:  RPR: Non Reactive (11/27 1039)   Rhogam  HBsAg: Negative (11/27 1039)   TDaP vaccine                       Flu Shot: HIV:     Baby Food                                GBS:   Contraception  Pap:  CBB     CS/VBAC    Support Person               NT scan and first trimester screen today.    Discussed that some uterine cramping is normal in absence of bleeding or loss of fluid. Return to care if other symptoms arise.  Preterm labor symptoms and general obstetric precautions including but not limited to vaginal bleeding, contractions, leaking of fluid and fetal movement were reviewed in detail with the patient.  Return in about 4 weeks (around 07/13/2017) for ROB.  Marcelyn BruinsJacelyn Idonia Zollinger, CNM 06/15/2017  10:11 AM

## 2017-06-18 LAB — FIRST TRIMESTER SCREEN W/NT
CRL: 57.5 mm
DIA MoM: 1.41
DIA Value: 373.1 pg/mL
GEST AGE-COLLECT: 12.3 wk
Maternal Age At EDD: 29.3 yr
Nuchal Translucency MoM: 1.34
Nuchal Translucency: 2 mm
Number of Fetuses: 1
PAPP-A MOM: 1.23
PAPP-A Value: 1310.1 ng/mL
Test Results:: NEGATIVE
Weight: 133 [lb_av]
hCG MoM: 1.3
hCG Value: 126.4 IU/mL

## 2017-06-29 NOTE — L&D Delivery Note (Signed)
Date of delivery: 12/11/2017 Estimated Date of Delivery: 12/24/17 Patient's last menstrual period was 03/19/2017 (approximate). EGA: 8171w1d  Delivery Note At 5:20 PM a viable female was delivered via Vaginal, Spontaneous (Presentation: OA;  LOA).  APGAR: 8, 9; weight:  2948 g.   Placenta status: spontaneous, intact.  Cord:  with the following complications: none.  Cord pH: NA  Called to see patient.  Mom pushed to deliver a viable female infant.  The head followed by shoulders, which delivered without difficulty, and the rest of the body.  Body cord noted and easily reduced after delivery.  Baby to mom's chest.  Cord clamped and cut after 3 min delay.  Cord blood obtained.  Placenta delivered spontaneously, intact, with a 3-vessel cord.  All counts correct.  Hemostasis obtained with IV pitocin and fundal massage.   Anesthesia: none Episiotomy: none Lacerations: none Suture Repair: NA Est. Blood Loss (mL): 250  Mom to postpartum.  Baby to Couplet care / Skin to Skin.  Tresea MallJane Darriona Dehaas, CNM 12/11/2017, 6:05 PM

## 2017-07-13 ENCOUNTER — Encounter: Payer: Self-pay | Admitting: Obstetrics and Gynecology

## 2017-07-13 ENCOUNTER — Encounter: Payer: Medicaid Other | Admitting: Obstetrics and Gynecology

## 2017-07-13 ENCOUNTER — Ambulatory Visit (INDEPENDENT_AMBULATORY_CARE_PROVIDER_SITE_OTHER): Payer: Medicaid Other | Admitting: Obstetrics and Gynecology

## 2017-07-13 VITALS — BP 118/70 | Wt 140.0 lb

## 2017-07-13 DIAGNOSIS — Z3A16 16 weeks gestation of pregnancy: Secondary | ICD-10-CM

## 2017-07-13 DIAGNOSIS — Z3480 Encounter for supervision of other normal pregnancy, unspecified trimester: Secondary | ICD-10-CM

## 2017-07-13 NOTE — Progress Notes (Signed)
  Routine Prenatal Care Visit  Subjective  Carolyn Chavez is a 27 y.o. G5P4004 at 7334w4d being seen today for ongoing prenatal care.  She is currently monitored for the following issues for this low-risk pregnancy and has Supervision of other normal pregnancy, antepartum on their problem list.  ----------------------------------------------------------------------------------- Patient reports no complaints.    . Vag. Bleeding: None.  Movement: Absent. Denies leaking of fluid.  Discussed msAFP today. She would like to consider. Declines flu vaccine ----------------------------------------------------------------------------------- The following portions of the patient's history were reviewed and updated as appropriate: allergies, current medications, past family history, past medical history, past social history, past surgical history and problem list. Problem list updated.  Objective  Blood pressure 118/70, weight 140 lb (63.5 kg), last menstrual period 03/19/2017, unknown if currently breastfeeding. Pregravid weight 126 lb (57.2 kg) Total Weight Gain 14 lb (6.35 kg) Urinalysis: Urine Protein: Negative Urine Glucose: Negative  Fetal Status: Fetal Heart Rate (bpm): 145   Movement: Absent     General:  Alert, oriented and cooperative. Patient is in no acute distress.  Skin: Skin is warm and dry. No rash noted.   Cardiovascular: Normal heart rate noted  Respiratory: Normal respiratory effort, no problems with respiration noted  Abdomen: Soft, gravid, appropriate for gestational age. Pain/Pressure: Absent     Pelvic:  Cervical exam deferred        Extremities: Normal range of motion.     Mental Status: Normal mood and affect. Normal behavior. Normal judgment and thought content.   Assessment   27 y.o. [id number] at 7334w4d by  12/24/2017, by Last Menstrual Period presenting for routine prenatal visit  Plan   FIFTH Problems (from 05/25/17 to present)    Problem Noted Resolved   Supervision  of other normal pregnancy, antepartum 05/25/2017 by Oswaldo ConroySchmid, Jacelyn Y, CNM No   Overview Addendum 07/13/2017 11:48 AM by Conard NovakJackson, Georgianna Band D, MD    Clinic [facility] Prenatal Labs  Dating LMP Blood type: O/Positive/-- (11/27 1039)   Genetic Screen 1 Screen: Negative, msAFP - considering      Antibody:Negative (11/27 1039)  Anatomic US  Rubella: 3.55 (11/27 1039) Varicella:    GTT Third trimester:  RPR: Non Reactive (11/27 1039)   Rhogam n/a HBsAg: Negative (11/27 1039)   TDaP vaccine                        Flu Shot: declines HIV: NR  Baby Food                                GBS:   Contraception  Pap: 04/2017 NILM  CBB     CS/VBAC    Support Person               Preterm labor symptoms and general obstetric precautions including but not limited to vaginal bleeding, contractions, leaking of fluid and fetal movement were reviewed in detail with the patient. Please refer to After Visit Summary for other counseling recommendations.   Return in about 3 weeks (around 08/03/2017) for schedule u/s for anatomy and routine prenatal after.  Thomasene MohairStephen Argentina Kosch, MD  07/13/2017 11:48 AM

## 2017-07-13 NOTE — Patient Instructions (Signed)
Second Trimester of Pregnancy The second trimester is from week 13 through week 28, month 4 through 6. This is often the time in pregnancy that you feel your best. Often times, morning sickness has lessened or quit. You may have more energy, and you may get hungry more often. Your unborn baby (fetus) is growing rapidly. At the end of the sixth month, he or she is about 9 inches long and weighs about 1 pounds. You will likely feel the baby move (quickening) between 18 and 20 weeks of pregnancy. Follow these instructions at home:  Avoid all smoking, herbs, and alcohol. Avoid drugs not approved by your doctor.  Do not use any tobacco products, including cigarettes, chewing tobacco, and electronic cigarettes. If you need help quitting, ask your doctor. You may get counseling or other support to help you quit.  Only take medicine as told by your doctor. Some medicines are safe and some are not during pregnancy.  Exercise only as told by your doctor. Stop exercising if you start having cramps.  Eat regular, healthy meals.  Wear a good support bra if your breasts are tender.  Do not use hot tubs, steam rooms, or saunas.  Wear your seat belt when driving.  Avoid raw meat, uncooked cheese, and liter boxes and soil used by cats.  Take your prenatal vitamins.  Take 1500-2000 milligrams of calcium daily starting at the 20th week of pregnancy until you deliver your baby.  Try taking medicine that helps you poop (stool softener) as needed, and if your doctor approves. Eat more fiber by eating fresh fruit, vegetables, and whole grains. Drink enough fluids to keep your pee (urine) clear or pale yellow.  Take warm water baths (sitz baths) to soothe pain or discomfort caused by hemorrhoids. Use hemorrhoid cream if your doctor approves.  If you have puffy, bulging veins (varicose veins), wear support hose. Raise (elevate) your feet for 15 minutes, 3-4 times a day. Limit salt in your diet.  Avoid heavy  lifting, wear low heals, and sit up straight.  Rest with your legs raised if you have leg cramps or low back pain.  Visit your dentist if you have not gone during your pregnancy. Use a soft toothbrush to brush your teeth. Be gentle when you floss.  You can have sex (intercourse) unless your doctor tells you not to.  Go to your doctor visits. Get help if:  You feel dizzy.  You have mild cramps or pressure in your lower belly (abdomen).  You have a nagging pain in your belly area.  You continue to feel sick to your stomach (nauseous), throw up (vomit), or have watery poop (diarrhea).  You have bad smelling fluid coming from your vagina.  You have pain with peeing (urination). Get help right away if:  You have a fever.  You are leaking fluid from your vagina.  You have spotting or bleeding from your vagina.  You have severe belly cramping or pain.  You lose or gain weight rapidly.  You have trouble catching your breath and have chest pain.  You notice sudden or extreme puffiness (swelling) of your face, hands, ankles, feet, or legs.  You have not felt the baby move in over an hour.  You have severe headaches that do not go away with medicine.  You have vision changes. This information is not intended to replace advice given to you by your health care provider. Make sure you discuss any questions you have with your health care   provider. Document Released: 09/09/2009 Document Revised: 11/21/2015 Document Reviewed: 08/16/2012 Elsevier Interactive Patient Education  2017 Elsevier Inc.  

## 2017-07-30 ENCOUNTER — Ambulatory Visit (INDEPENDENT_AMBULATORY_CARE_PROVIDER_SITE_OTHER): Payer: Medicaid Other | Admitting: Certified Nurse Midwife

## 2017-07-30 ENCOUNTER — Encounter: Payer: Medicaid Other | Admitting: Certified Nurse Midwife

## 2017-07-30 VITALS — BP 100/60 | Wt 144.0 lb

## 2017-07-30 DIAGNOSIS — R42 Dizziness and giddiness: Secondary | ICD-10-CM | POA: Diagnosis not present

## 2017-07-30 DIAGNOSIS — Z3A19 19 weeks gestation of pregnancy: Secondary | ICD-10-CM

## 2017-07-30 DIAGNOSIS — O99012 Anemia complicating pregnancy, second trimester: Secondary | ICD-10-CM

## 2017-07-30 DIAGNOSIS — Z331 Pregnant state, incidental: Secondary | ICD-10-CM

## 2017-07-30 DIAGNOSIS — R55 Syncope and collapse: Secondary | ICD-10-CM

## 2017-07-30 HISTORY — DX: Anemia complicating pregnancy, second trimester: O99.012

## 2017-07-30 LAB — POCT HEMOGLOBIN: Hemoglobin: 10.1 g/dL — AB (ref 12.2–16.2)

## 2017-07-30 LAB — GLUCOSE, POCT (MANUAL RESULT ENTRY): POC Glucose: 145 mg/dl — AB (ref 70–99)

## 2017-07-30 NOTE — Progress Notes (Signed)
27 yo G5 P4004 with EDC=12/24/2017-Work in appointment at 19 weeks with complaints of nausea and feeling faint. Was at work around noon, standing, started to feel hot then nauseous, then faint/lightheaded Sat down in the walk in cooler and ate some crackers and drank some OJ, felt a little better, but sx did not resolve totally. Today she ate a doughnut for breakfast, OJ and crackers, and some FF on the way to her appointment. Denies SOB, CP or palpitations. Has felt some fetal flutters, not on a daily basis. Also has had some cramping on either side of her lower abdomen Denies cough, sore throat, sinus pain, ear pain. No vaginal bleeding or dysuria  EXam: General: BF in NAD VS: 100/60. Weight 144# (up 4 # over last 2 weeks) HEENT: pale conjunctiva Heart: RRR without murmur Chest: normal respiratory effort Abdomen: uterus and abdomen soft, has an umbilical hernia (tenderness at umbilicus), FH at U, FHTs WNL. Mild tenderness on borders of lower uterus Extremities: no edema In lower extremities  Results for orders placed or performed in visit on 07/30/17 (from the past 24 hour(s))  POCT Glucose (CBG)     Status: Abnormal   Collection Time: 07/30/17  3:44 PM  Result Value Ref Range   POC Glucose 145 (A) 70 - 99 mg/dl  POCT hemoglobin     Status: Abnormal   Collection Time: 07/30/17  3:45 PM  Result Value Ref Range   Hemoglobin 10.1 (A) 12.2 - 16.2 g/dL  Urine dipstick: 1.3081.010 sp gravity, trace protein, negative leukocytes and nitrite  A: IUP at 19 weeks Suspect vasovagal episode due to orthostatic hypotension R/O hypoglycemia Mild anemia Probable round ligament discomfort  P: Discussed avoiding standing or sitting in one spot without pedaling feet to help blood return to heart Encouraged increasing protein and iron in diet Continue PNV with iron Consider adding Fe supplement Maternity belt may help with round ligament discomfort Home to rest with legs elevated FU at visit next  week Farrel Connersolleen Zully Frane, CNM

## 2017-07-30 NOTE — Progress Notes (Signed)
Pt started feeling bad around 12:00 today. Tried to eat and drink to help with nausea and dizziness but didn't seem like it was getting any better.   Random glucose 145 Hemoglobin 10.1

## 2017-08-03 ENCOUNTER — Ambulatory Visit (INDEPENDENT_AMBULATORY_CARE_PROVIDER_SITE_OTHER): Payer: Medicaid Other | Admitting: Obstetrics and Gynecology

## 2017-08-03 ENCOUNTER — Ambulatory Visit (INDEPENDENT_AMBULATORY_CARE_PROVIDER_SITE_OTHER): Payer: Medicaid Other

## 2017-08-03 VITALS — BP 102/68 | Wt 145.0 lb

## 2017-08-03 DIAGNOSIS — Z3480 Encounter for supervision of other normal pregnancy, unspecified trimester: Secondary | ICD-10-CM

## 2017-08-03 DIAGNOSIS — Z0489 Encounter for examination and observation for other specified reasons: Secondary | ICD-10-CM

## 2017-08-03 DIAGNOSIS — IMO0002 Reserved for concepts with insufficient information to code with codable children: Secondary | ICD-10-CM

## 2017-08-03 DIAGNOSIS — Z3A19 19 weeks gestation of pregnancy: Secondary | ICD-10-CM

## 2017-08-03 NOTE — Progress Notes (Signed)
Routine Prenatal Care Visit  Subjective  Carolyn Chavez is a 27 y.o. G5P4004 at [redacted]w[redacted]d being seen today for ongoing prenatal care.  She is currently monitored for the following issues for this low-risk pregnancy and has Supervision of other normal pregnancy, antepartum and Anemia affecting pregnancy in second trimester on their problem list.  ----------------------------------------------------------------------------------- Patient reports no complaints.   Contractions: Not present. Vag. Bleeding: None.  Movement: Present. Denies leaking of fluid.  ----------------------------------------------------------------------------------- The following portions of the patient's history were reviewed and updated as appropriate: allergies, current medications, past family history, past medical history, past social history, past surgical history and problem list. Problem list updated.   Objective  Blood pressure 102/68, weight 145 lb (65.8 kg), last menstrual period 03/19/2017, unknown if currently breastfeeding. Pregravid weight 126 lb (57.2 kg) Total Weight Gain 19 lb (8.618 kg) Urinalysis:      Fetal Status: Fetal Heart Rate (bpm): 150   Movement: Present     General:  Alert, oriented and cooperative. Patient is in no acute distress.  Skin: Skin is warm and dry. No rash noted.   Cardiovascular: Normal heart rate noted  Respiratory: Normal respiratory effort, no problems with respiration noted  Abdomen: Soft, gravid, appropriate for gestational age. Pain/Pressure: Absent     Pelvic:  Cervical exam deferred        Extremities: Normal range of motion.     ental Status: Normal mood and affect. Normal behavior. Normal judgment and thought content.   US Ob Comp + 14 Wk  Result Date: 08/03/2017 ULTRASOUND REPORT Location: [facility] Date of Service: 08/03/2017 Indications:Anatomy U/S Findings: Mason Jim intrauterine pregnancy is visualized with FHR at 148 BPM. Biometrics give an (U/S)  Gestational age of [redacted]w[redacted]d and an (U/S) EDD of 12/30/2017; this correlates with the clinically established Estimated Date of Delivery: 12/24/17 Fetal presentation is Cephalic. EFW: 9 oz. Placenta: Anterior, grade 1. AFI: subjectively normal. Anatomic survey is incomplete for cardiac views d/t fetal position  and normal; Gender - surprise.  Right Ovary is normal in appearance. Left Ovary is normal appearance. Survey of the adnexa demonstrates no adnexal masses. There is no free peritoneal fluid in the cul de sac. Impression: 1. [redacted]w[redacted]d Viable Singleton Intrauterine pregnancy by U/S. 2. (U/S) EDD is consistent with Clinically established Estimated Date of Delivery: 12/24/17 . 3. Normal Anatomy Scan Recommendations: 1.Clinical correlation with the patient's History and Physical Exam. 2. Follow up in 3-4 weeks for completion of anatomic survey. Willette Alma, RDMS, RVT  There is a singleton gestation with subjectively normal amniotic fluid volume. The fetal biometry correlates with established dating. Detailed evaluation of the fetal anatomy was performed.The fetal anatomical survey appears within normal limits within the resolution of ultrasound as described above, heart views were suboptimal given fetal positioning and will be repeated in 4 weeks.  It must be noted that a normal ultrasound is unable to rule out fetal aneuploidy.  Vena Austria, MD Domingo Pulse, [facility]    Assessment   26 y.o. [phone] at [redacted]w[redacted]d by  12/24/2017, by Last Menstrual Period presenting for routine prenatal visit  Plan   FIFTH Problems (from 05/25/17 to present)    Problem Noted Resolved   Supervision of other normal pregnancy, antepartum 05/25/2017 by Oswaldo Conroy, CNM No   Overview Addendum 07/13/2017 11:48 AM by Conard Novak, MD    Clinic [facility] Prenatal Labs  Dating LMP unsure, dating by 9 week [place] Blood type: O/Positive/-- (11/27 1039)  Genetic Screen 1 Screen: Negative, msAFP - declined   Antibody:Negative (11/27 1039)  Anatomic US Incomplete heart views [ ]  F/U 24 weeks Rubella: 3.55 (11/27 1039) Varicella:    GTT Third trimester:  RPR: Non Reactive (11/27 1039)   Rhogam n/a HBsAg: Negative (11/27 1039)   TDaP vaccine                        Flu Shot: declines HIV: NR  Baby Food                                GBS:   Contraception  Pap: 04/2017 NILM  CBB     CS/VBAC    Support Person                  Preterm labor symptoms and general obstetric precautions including but not limited to vaginal bleeding, contractions, leaking of fluid and fetal movement were reviewed in detail with the patient. Please refer to After Visit Summary for other counseling recommendations.   Return in about 4 weeks (around 08/31/2017) for ROB and follow up anatomy scan.

## 2017-08-03 NOTE — Progress Notes (Signed)
ROB Anatomy Scan today/ DO NOT REVEAL GENDER

## 2017-08-31 ENCOUNTER — Ambulatory Visit (INDEPENDENT_AMBULATORY_CARE_PROVIDER_SITE_OTHER): Payer: Medicaid Other | Admitting: Maternal Newborn

## 2017-08-31 ENCOUNTER — Ambulatory Visit (INDEPENDENT_AMBULATORY_CARE_PROVIDER_SITE_OTHER): Payer: Medicaid Other

## 2017-08-31 ENCOUNTER — Encounter: Payer: Self-pay | Admitting: Maternal Newborn

## 2017-08-31 VITALS — BP 90/50 | Wt 146.0 lb

## 2017-08-31 DIAGNOSIS — IMO0002 Reserved for concepts with insufficient information to code with codable children: Secondary | ICD-10-CM

## 2017-08-31 DIAGNOSIS — Z3A23 23 weeks gestation of pregnancy: Secondary | ICD-10-CM

## 2017-08-31 DIAGNOSIS — Z3480 Encounter for supervision of other normal pregnancy, unspecified trimester: Secondary | ICD-10-CM

## 2017-08-31 DIAGNOSIS — Z0489 Encounter for examination and observation for other specified reasons: Secondary | ICD-10-CM | POA: Diagnosis not present

## 2017-08-31 DIAGNOSIS — Z862 Personal history of diseases of the blood and blood-forming organs and certain disorders involving the immune mechanism: Secondary | ICD-10-CM

## 2017-08-31 NOTE — Progress Notes (Addendum)
    Routine Prenatal Care Visit  Subjective  Carolyn Chavez is a 27 y.o. G5P4004 at 165w4d being seen today for ongoing prenatal care.  She is currently monitored for the following issues for this low-risk pregnancy and has Supervision of other normal pregnancy, antepartum and Anemia affecting pregnancy in second trimester on their problem list.  ----------------------------------------------------------------------------------- Patient reports round ligament pain and nosebleeds.  She also has sinusitis/URI symptoms Contractions: Not present. Vag. Bleeding: None.  Movement: Present. Denies leaking of fluid.  ----------------------------------------------------------------------------------- The following portions of the patient's history were reviewed and updated as appropriate: allergies, current medications, past family history, past medical history, past social history, past surgical history and problem list. Problem list updated.   Objective  Blood pressure (!) 90/50, weight 146 lb (66.2 kg), last menstrual period 03/19/2017. Pregravid weight 126 lb (57.2 kg) Total Weight Gain 20 lb (9.072 kg) Urinalysis: Urine Protein: Negative Urine Glucose: Negative  Fetal Status: Fetal Heart Rate (bpm): 150 Fundal Height: 23 cm Movement: Present     General:  Alert, oriented and cooperative. Patient is in no acute distress.  Skin: Skin is warm and dry. No rash noted.   Cardiovascular: Normal heart rate noted  Respiratory: Normal respiratory effort, no problems with respiration noted  Abdomen: Soft, gravid, appropriate for gestational age. Pain/Pressure: Absent     Pelvic:  Cervical exam deferred        Extremities: Normal range of motion.  Edema: None  Mental Status: Normal mood and affect. Normal behavior. Normal judgment and thought content.   Bilateral ears examined with otoscope, no signs of fluid or inflammation.  Assessment   26 y.o. [id number] at 285w4d, EDD 12/24/2017 by Ultrasound  presenting for routine prenatal visit.  Plan   FIFTH Problems (from 05/25/17 to present)    Problem Noted Resolved   Supervision of other normal pregnancy, antepartum 05/25/2017 by Oswaldo ConroySchmid, Jacelyn Y, CNM No   Overview Addendum 08/03/2017  9:56 AM by Vena AustriaStaebler, Andreas, MD    Clinic [facility] Prenatal Labs  Dating LMP unsure, dating by 9 week US Blood type: O/Positive/-- (11/27 1039)   Genetic Screen 1 Screen: Negative, msAFP - declined   Antibody:Negative (11/27 1039)  Anatomic US Complete 08/31/2017 Rubella: 3.55 (11/27 1039) Varicella:    GTT Third trimester:  RPR: Non Reactive (11/27 1039)   Rhogam n/a HBsAg: Negative (11/27 1039)   TDaP vaccine                        Flu Shot: declines HIV: NR  Baby Food                                GBS:   Contraception  Pap: 04/2017 NILM  CBB     CS/VBAC    Support Person               Anatomy scan complete today. Discussed non-pharmaceutical comfort measures for URI symptoms.  Patient reports nosebleeds and bleeding gums, platelet check done today because of history of gestational thrombocytopenia.  Preterm labor symptoms and general obstetric precautions were reviewed with the patient.  Return in about 4 weeks (around 09/28/2017) for ROB with GTT/28 week labs.  Marcelyn BruinsJacelyn Schmid, CNM 08/31/2017  1:33 PM

## 2017-08-31 NOTE — Progress Notes (Signed)
C/o nose bleeds, pains even c belly band.rj

## 2017-09-01 LAB — PLATELET COUNT: PLATELETS: 106 10*3/uL — AB (ref 150–379)

## 2017-09-14 ENCOUNTER — Other Ambulatory Visit: Payer: Self-pay | Admitting: Maternal Newborn

## 2017-09-14 DIAGNOSIS — D696 Thrombocytopenia, unspecified: Secondary | ICD-10-CM

## 2017-09-14 DIAGNOSIS — O99119 Other diseases of the blood and blood-forming organs and certain disorders involving the immune mechanism complicating pregnancy, unspecified trimester: Principal | ICD-10-CM

## 2017-09-15 ENCOUNTER — Telehealth: Payer: Self-pay | Admitting: Maternal Newborn

## 2017-09-15 NOTE — Telephone Encounter (Signed)
Patient is calling to find out about her lab results.

## 2017-09-15 NOTE — Treatment Plan (Signed)
Discussed hematology consult for platelets of 106, also historical lab values at [facility]B and during her previous pregnancy. Will develop plan of care according to recommendations from hematology.  Marcelyn BruinsJacelyn Schmid, CNM 09/15/2017  10:05 AM

## 2017-09-17 ENCOUNTER — Inpatient Hospital Stay: Payer: Medicaid Other | Admitting: Oncology

## 2017-09-17 ENCOUNTER — Telehealth: Payer: Self-pay | Admitting: *Deleted

## 2017-09-20 ENCOUNTER — Other Ambulatory Visit: Payer: Self-pay | Admitting: *Deleted

## 2017-09-20 ENCOUNTER — Inpatient Hospital Stay: Payer: Medicaid Other | Attending: Oncology | Admitting: Oncology

## 2017-09-20 ENCOUNTER — Inpatient Hospital Stay: Payer: Medicaid Other

## 2017-09-20 ENCOUNTER — Encounter: Payer: Self-pay | Admitting: Oncology

## 2017-09-20 VITALS — BP 111/71 | HR 66 | Temp 97.8°F | Resp 18 | Ht 66.0 in | Wt 150.7 lb

## 2017-09-20 DIAGNOSIS — D696 Thrombocytopenia, unspecified: Secondary | ICD-10-CM | POA: Diagnosis not present

## 2017-09-20 DIAGNOSIS — Z87891 Personal history of nicotine dependence: Secondary | ICD-10-CM | POA: Insufficient documentation

## 2017-09-20 DIAGNOSIS — D649 Anemia, unspecified: Secondary | ICD-10-CM

## 2017-09-20 DIAGNOSIS — O99012 Anemia complicating pregnancy, second trimester: Secondary | ICD-10-CM

## 2017-09-20 DIAGNOSIS — D693 Immune thrombocytopenic purpura: Principal | ICD-10-CM

## 2017-09-20 DIAGNOSIS — Z87440 Personal history of urinary (tract) infections: Secondary | ICD-10-CM | POA: Diagnosis not present

## 2017-09-20 DIAGNOSIS — R5383 Other fatigue: Secondary | ICD-10-CM | POA: Insufficient documentation

## 2017-09-20 DIAGNOSIS — O99112 Other diseases of the blood and blood-forming organs and certain disorders involving the immune mechanism complicating pregnancy, second trimester: Secondary | ICD-10-CM

## 2017-09-20 DIAGNOSIS — Z801 Family history of malignant neoplasm of trachea, bronchus and lung: Secondary | ICD-10-CM | POA: Insufficient documentation

## 2017-09-20 DIAGNOSIS — Z331 Pregnant state, incidental: Secondary | ICD-10-CM | POA: Diagnosis not present

## 2017-09-20 LAB — COMPREHENSIVE METABOLIC PANEL
ALBUMIN: 2.9 g/dL — AB (ref 3.5–5.0)
ALT: 8 U/L — ABNORMAL LOW (ref 14–54)
AST: 16 U/L (ref 15–41)
Alkaline Phosphatase: 51 U/L (ref 38–126)
Anion gap: 7 (ref 5–15)
BUN: 5 mg/dL — AB (ref 6–20)
CHLORIDE: 102 mmol/L (ref 101–111)
CO2: 24 mmol/L (ref 22–32)
Calcium: 8.2 mg/dL — ABNORMAL LOW (ref 8.9–10.3)
Creatinine, Ser: 0.48 mg/dL (ref 0.44–1.00)
GFR calc Af Amer: >60 mL/min (ref >60)
GLUCOSE: 86 mg/dL (ref 65–99)
POTASSIUM: 3.6 mmol/L (ref 3.5–5.1)
Sodium: 133 mmol/L — ABNORMAL LOW (ref 135–145)
Total Bilirubin: 0.7 mg/dL (ref 0.3–1.2)
Total Protein: 7 g/dL (ref 6.5–8.1)

## 2017-09-20 LAB — IRON AND TIBC
Iron: 55 ug/dL (ref 28–170)
SATURATION RATIOS: 11 % (ref 10.4–31.8)
TIBC: 482 ug/dL — ABNORMAL HIGH (ref 250–450)
UIBC: 427 ug/dL

## 2017-09-20 LAB — CBC WITH DIFFERENTIAL/PLATELET
Basophils Absolute: 0 10*3/uL (ref 0–0.1)
Basophils Relative: 0 %
EOS PCT: 3 %
Eosinophils Absolute: 0.2 10*3/uL (ref 0–0.7)
HEMATOCRIT: 34.4 % — AB (ref 35.0–47.0)
HEMOGLOBIN: 11.8 g/dL — AB (ref 12.0–16.0)
LYMPHS ABS: 1.1 10*3/uL (ref 1.0–3.6)
LYMPHS PCT: 17 %
MCH: 32.2 pg (ref 26.0–34.0)
MCHC: 34.4 g/dL (ref 32.0–36.0)
MCV: 93.5 fL (ref 80.0–100.0)
Monocytes Absolute: 0.4 10*3/uL (ref 0.2–0.9)
Monocytes Relative: 6 %
NEUTROS ABS: 5 10*3/uL (ref 1.4–6.5)
Neutrophils Relative %: 74 %
PLATELETS: 101 10*3/uL — AB (ref 150–440)
RBC: 3.68 MIL/uL — ABNORMAL LOW (ref 3.80–5.20)
RDW: 13.8 % (ref 11.5–14.5)
WBC: 6.7 10*3/uL (ref 3.6–11.0)

## 2017-09-20 LAB — RETICULOCYTES
RBC.: 3.69 MIL/uL — ABNORMAL LOW (ref 3.80–5.20)
RETIC COUNT ABSOLUTE: 66.4 10*3/uL (ref 19.0–183.0)
Retic Ct Pct: 1.8 % (ref 0.4–3.1)

## 2017-09-20 LAB — PROTIME-INR
INR: 0.99
PROTHROMBIN TIME: 13 s (ref 11.4–15.2)

## 2017-09-20 LAB — LACTATE DEHYDROGENASE: LDH: 138 U/L (ref 98–192)

## 2017-09-20 LAB — APTT: aPTT: 31 seconds (ref 24–36)

## 2017-09-20 LAB — TECHNOLOGIST SMEAR REVIEW

## 2017-09-20 LAB — FERRITIN: Ferritin: 7 ng/mL — ABNORMAL LOW (ref 11–307)

## 2017-09-20 LAB — FOLATE: Folate: 17 ng/mL (ref >5.9)

## 2017-09-20 LAB — PATHOLOGIST SMEAR REVIEW

## 2017-09-20 LAB — VITAMIN B12: Vitamin B-12: 198 pg/mL (ref 180–914)

## 2017-09-20 NOTE — Progress Notes (Signed)
Nose bleed x 1-2 times a week ( clean with 2-3 Q tips and only in 1 nostril ) not several bleeding. Patient statse this is a new issue.

## 2017-09-20 NOTE — Progress Notes (Signed)
Hematology/Oncology Consult note [facility] [phone] [phone]  Patient Care Team: System, Pcp Not In as PCP - General   Name of the patient: Carolyn Chavez  [id number]  [birth year]    Reason for referral- thrombocytopenia in pregnancy   Referring physician-Jacelyn Bridgette Habermann CNM  Date of visit: 09/20/17   History of presenting illness-patient is a 27 year old African-American female G5P5 L4 currently [redacted] weeks pregnant with her fifth pregnancy.  She has been referred to [place] for thrombocytopenia in pregnancy.  All her 4 prior pregnancies have been normal rate and agrees without any bleeding complications.  She was however noted to have thrombocytopenia during her last 2 pregnancies in 2013 and 2016.  Her platelet counts during last 2 pregnancies were between 60-70s.  She was induced at 37 weeks during her fourth pregnancy as her platelet count dropped to 60.  She has not required any platelet or blood transfusions during her pregnancy and her thrombocytopenia has also not required any treatment so far during her pregnancies.  She does not remember if her platelet count was checked outside of her pregnancy.  She currently reports occasional nosebleeds but denies other complaints she has not had any vaginal bleeding.  She has not required epidural anesthesia for her 4 prior pregnancies.  Currently patient reports mild fatigue but denies other complaints.  Her hemoglobin in February 2019 was 10.1.  She is currently taking prenatal vitamins.  Denies any over-the-counter medications or herbal supplements.   ECOG PS- 0  Pain scale- 0   Review of systems- Review of Systems  Constitutional: Positive for malaise/fatigue. Negative for chills, fever and weight loss.  HENT: Positive for nosebleeds (Occasional and self-limited). Negative for congestion and ear discharge.   Eyes: Negative for blurred vision.  Respiratory: Negative for cough, hemoptysis,  sputum production, shortness of breath and wheezing.   Cardiovascular: Negative for chest pain, palpitations, orthopnea and claudication.  Gastrointestinal: Negative for abdominal pain, blood in stool, constipation, diarrhea, heartburn, melena, nausea and vomiting.  Genitourinary: Negative for dysuria, flank pain, frequency, hematuria and urgency.  Musculoskeletal: Negative for back pain, joint pain and myalgias.  Skin: Negative for rash.  Neurological: Negative for dizziness, tingling, focal weakness, seizures, weakness and headaches.  Endo/Heme/Allergies: Does not bruise/bleed easily.  Psychiatric/Behavioral: Negative for depression and suicidal ideas. The patient does not have insomnia.     No Known Allergies  Patient Active Problem List   Diagnosis Date Noted  . Anemia affecting pregnancy in second trimester 07/30/2017  . Supervision of other normal pregnancy, antepartum 05/25/2017     Past Medical History:  Diagnosis Date  . Abnormal Pap smear of cervix    age 60  . History of Papanicolaou smear of cervix [id number]; 04/01/2011   neg; neg, ct/gc neg;   . Pica    craves flour  . Thrombocytopenia (HCC)   . UTI (urinary tract infection)      Past Surgical History:  Procedure Laterality Date  . COLPOSCOPY      Social History   Socioeconomic History  . Marital status: Married    Spouse name: Not on file  . Number of children: 4  . Years of education: 9  . Highest education level: Not on file  Occupational History  . Not on file  Social Needs  . Financial resource strain: Not on file  . Food insecurity:    Worry: Not on file    Inability: Not on file  . Transportation needs:    Medical:  Not on file    Non-medical: Not on file  Tobacco Use  . Smoking status: Former Smoker    Last attempt to quit: 03/22/2014    Years since quitting: 3.5  . Smokeless tobacco: Never Used  Substance and Sexual Activity  . Alcohol use: Yes  . Drug use: No  . Sexual activity: Yes      Birth control/protection: Other-see comments  Lifestyle  . Physical activity:    Days per week: Not on file    Minutes per session: Not on file  . Stress: Not on file  Relationships  . Social connections:    Talks on phone: Not on file    Gets together: Not on file    Attends religious service: Not on file    Active member of club or organization: Not on file    Attends meetings of clubs or organizations: Not on file    Relationship status: Not on file  . Intimate partner violence:    Fear of current or ex partner: Not on file    Emotionally abused: Not on file    Physically abused: Not on file    Forced sexual activity: Not on file  Other Topics Concern  . Not on file  Social History Narrative  . Not on file     Family History  Problem Relation Age of Onset  . Cancer Maternal Grandfather 60       lung, brain  . Hypertension Maternal Grandfather   . Schizophrenia Brother   . Hearing loss Daughter        2/16; oldest daughter; low freq loss     Current Outpatient Medications:  .  Prenatal Vit-Fe Fumarate-FA (PRENATAL MULTIVITAMIN) TABS tablet, Take 1 tablet by mouth daily at 12 noon., Disp: , Rfl:    Physical exam:  Vitals:   09/20/17 1032 09/20/17 1034  BP:  111/71  Pulse:  66  Resp:  18  Temp:  97.8 F (36.6 C)  TempSrc:  Tympanic  Weight: 150 lb 11 oz (68.3 kg) 150 lb 11 oz (68.3 kg)  Height: 5\' 6"  (1.676 m) 5\' 6"  (1.676 m)   Physical Exam  Constitutional: She is oriented to Chavez, place, and time and well-developed, well-nourished, and in no distress.  HENT:  Head: Normocephalic and atraumatic.  Eyes: Pupils are equal, round, and reactive to light. EOM are normal.  Neck: Normal range of motion.  Cardiovascular: Normal rate, regular rhythm and normal heart sounds.  Pulmonary/Chest: Effort normal and breath sounds normal.  Abdominal: Soft. Bowel sounds are normal.  Gravid uterus  Musculoskeletal: She exhibits no edema.  Neurological: She is alert  and oriented to Chavez, place, and time.  Skin: Skin is warm and dry.       CMP Latest Ref Rng & Units 07/15/2014  Glucose 65 - 99 mg/dL 74  BUN 7 - 18 mg/dL 4(L)  Creatinine 8.84 - 1.30 mg/dL 1.66  Sodium 063 - 016 mmol/L 137  Potassium 3.5 - 5.1 mmol/L 4.0  Chloride 98 - 107 mmol/L 105  CO2 21 - 32 mmol/L 24  Calcium 8.5 - 10.1 mg/dL 8.1(L)   CBC Latest Ref Rng & Units 08/31/2017  WBC 3.4 - 10.8 x10E3/uL -  Hemoglobin 12.2 - 16.2 g/dL -  Hematocrit 01.0 - 93.2 % -  Platelets 150 - 379 x10E3/uL 106(L)    No images are attached to the encounter.  US Ob Follow Up  Result Date: 09/01/2017 ULTRASOUND REPORT Location: [facility] Date of  Service: 08/31/2017 Indications:F/U Anatomy Findings: Mason JimSingleton intrauterine pregnancy is visualized with FHR at 158 BPM. Fetal presentation is Cephalic. Placenta: Anterior, grade 1. AFI: subjectively normal. Anatomic survey is complete. Impression: 1. 5330w4d Viable Singleton Intrauterine pregnancy previously established criteria. 2. Normal Anatomy Scan is now complete Recommendations: 1.Clinical correlation with the patient's History and Physical Exam. Willette AlmaKristen Priestley, RDMS, RVT  There is a singleton gestation with subjectively normal amniotic fluid volume. The visualized fetal anatomy appears within normal limits within the resolution of ultrasound as described above.  It must be noted that a normal ultrasound is unable to rule out fetal aneuploidy.  Vena AustriaAndreas Staebler, MD, Evern CoreFACOG [facility], [facility] 09/01/2017, 1:35 PM    Assessment and plan- Patient is a 27 y.o. female with thrombocytopenia and second trimester of pregnancy  Patient was noted to have thrombocytopenia during her third pregnancy as well when her platelet counts were between 60s-70s.  This is likely gestational thrombocytopenia versus possible ITP given that we do not have any blood counts checked outside of her pregnancy.  For her thrombocytopenia CBC with  differential, smear review, B12 and folate and LDH.  Also check PT PT/INR.  Gestational thrombocytopenia is the most common cause of low platelet count and pregnancy.  Platelet counts typically did not fall below 70's.  It is difficult to distinguish between gestational thrombocytopenia and ITP.  No treatment for thrombocytopenia is needed as long as her platelet counts are more than 50. Typically is not treated unless platelet count is less than 30 in ITP.  I will closely monitor her blood counts especially during her third trimester of pregnancy and consider treatment with steroids and IVIG if her platelet counts drop below 50.    Normocytic anemia: Check CBC with differential, ferritin and iron studies, B12 folate, reticulocyte count and LDH today.    I will call her with the results of her blood work if she has any evidence of iron and B12 deficiency anemia and I will plan for supplementation accordingly.  I will see her back in 1 month's time with CBC with diff   Thank you for this kind referral and the opportunity to participate in the care of this patient   Visit Diagnosis 1. Benign gestational thrombocytopenia in second trimester (HCC)   2. Normocytic anemia   3. Anemia affecting pregnancy in second trimester     Dr. Owens SharkArchana Santanna Olenik, MD, MPH [facility] Pager- [phone] 09/20/2017 11:37 AM

## 2017-09-21 ENCOUNTER — Telehealth: Payer: Self-pay

## 2017-09-21 NOTE — Telephone Encounter (Signed)
Spoke with Ms. Carolyn Chavez about  an appointment due to / She is iron and B12 deficient. Per Dr Smith Robertao will see Ms. Carolyn Chavez next week in the [place] and start venofer and b12.The patient was agreeable and understanding with getting treatment. Message was sent Byrd HesselbachMaria (scheduler).

## 2017-09-21 NOTE — Telephone Encounter (Signed)
-----   Message from Corene CorneaSharon Y Venable, RN sent at 09/21/2017 11:33 AM EDT -----   ----- Message ----- From: Creig Hinesao, Archana C, MD Sent: 09/21/2017   8:10 AM To: Corene CorneaSharon Y Venable, RN  She is iron and B12 deficient. I will see her next week in [place] and start venofer and b12. Thanks, Ovidio KinArchana

## 2017-09-27 ENCOUNTER — Inpatient Hospital Stay: Payer: Medicaid Other | Attending: Oncology | Admitting: Oncology

## 2017-09-27 ENCOUNTER — Encounter: Payer: Self-pay | Admitting: Oncology

## 2017-09-27 ENCOUNTER — Inpatient Hospital Stay: Payer: Medicaid Other

## 2017-09-27 VITALS — BP 112/68 | HR 70 | Temp 97.0°F | Resp 18

## 2017-09-27 VITALS — BP 108/68 | HR 67 | Temp 97.0°F | Resp 18 | Ht 66.0 in | Wt 150.6 lb

## 2017-09-27 DIAGNOSIS — D649 Anemia, unspecified: Secondary | ICD-10-CM | POA: Insufficient documentation

## 2017-09-27 DIAGNOSIS — D696 Thrombocytopenia, unspecified: Secondary | ICD-10-CM | POA: Diagnosis present

## 2017-09-27 DIAGNOSIS — R5383 Other fatigue: Secondary | ICD-10-CM | POA: Insufficient documentation

## 2017-09-27 DIAGNOSIS — Z87442 Personal history of urinary calculi: Secondary | ICD-10-CM | POA: Insufficient documentation

## 2017-09-27 DIAGNOSIS — D509 Iron deficiency anemia, unspecified: Secondary | ICD-10-CM

## 2017-09-27 DIAGNOSIS — F5089 Other specified eating disorder: Secondary | ICD-10-CM | POA: Diagnosis not present

## 2017-09-27 DIAGNOSIS — O99119 Other diseases of the blood and blood-forming organs and certain disorders involving the immune mechanism complicating pregnancy, unspecified trimester: Secondary | ICD-10-CM

## 2017-09-27 DIAGNOSIS — Z331 Pregnant state, incidental: Secondary | ICD-10-CM | POA: Diagnosis not present

## 2017-09-27 DIAGNOSIS — Z801 Family history of malignant neoplasm of trachea, bronchus and lung: Secondary | ICD-10-CM | POA: Diagnosis not present

## 2017-09-27 DIAGNOSIS — E538 Deficiency of other specified B group vitamins: Secondary | ICD-10-CM | POA: Insufficient documentation

## 2017-09-27 DIAGNOSIS — Z808 Family history of malignant neoplasm of other organs or systems: Secondary | ICD-10-CM | POA: Diagnosis not present

## 2017-09-27 DIAGNOSIS — Z87891 Personal history of nicotine dependence: Secondary | ICD-10-CM | POA: Diagnosis not present

## 2017-09-27 HISTORY — DX: Iron deficiency anemia, unspecified: D50.9

## 2017-09-27 MED ORDER — SODIUM CHLORIDE 0.9 % IV SOLN
Freq: Once | INTRAVENOUS | Status: AC
  Administered 2017-09-27: 15:00:00 via INTRAVENOUS
  Filled 2017-09-27: qty 1000

## 2017-09-27 MED ORDER — IRON SUCROSE 20 MG/ML IV SOLN
200.0000 mg | INTRAVENOUS | Status: DC
  Administered 2017-09-27: 200 mg via INTRAVENOUS
  Filled 2017-09-27: qty 10

## 2017-09-27 NOTE — Progress Notes (Signed)
Hematology/Oncology Consult note [facility]  [phone] [phone]  Patient Care Team: System, Pcp Not In as PCP - General   Name of the patient: Carolyn Chavez  [id number]  1990-07-31   Date of visit: 09/27/17  Diagnosis- 1. 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 Iron and b12 deficiency anemia  Chief complaint/ Reason for visit- discuss results of bloodwork  Heme/Onc history: patient is a 27 year old African-American female G5P5 L4 currently [redacted] weeks pregnant with her fifth pregnancy.  She has been referred to [place] for thrombocytopenia in pregnancy.  All her 4 prior pregnancies have been normal rate and agrees without any bleeding complications.  She was however noted to have thrombocytopenia during her last 2 pregnancies in 2013 and 2016.  Her platelet counts during last 2 pregnancies were between 60-70s.  She was induced at 37 weeks during her fourth pregnancy as her platelet count dropped to 60.  She has not required any platelet or blood transfusions during her pregnancy and her thrombocytopenia has also not required any treatment so far during her pregnancies.  She does not remember if her platelet count was checked outside of her pregnancy.  She currently reports occasional nosebleeds but denies other complaints she has not had any vaginal bleeding.  She has not required epidural anesthesia for her 4 prior pregnancies.  Currently patient reports mild fatigue but denies other complaints.  Her hemoglobin in February 2019 was 10.1.  She is currently taking prenatal vitamins.  Denies any over-the-counter medications or herbal supplements.  Interval history- she feels well since her last visit. No new complaints today  ECOG PS- 0 Pain scale- 0   Review of systems- Review of Systems  Constitutional: Negative for chills, fever, malaise/fatigue and weight loss.  HENT: Negative for congestion, ear discharge and nosebleeds.   Eyes: Negative  for blurred vision.  Respiratory: Negative for cough, hemoptysis, sputum production, shortness of breath and wheezing.   Cardiovascular: Negative for chest pain, palpitations, orthopnea and claudication.  Gastrointestinal: Negative for abdominal pain, blood in stool, constipation, diarrhea, heartburn, melena, nausea and vomiting.  Genitourinary: Negative for dysuria, flank pain, frequency, hematuria and urgency.  Musculoskeletal: Negative for back pain, joint pain and myalgias.  Skin: Negative for rash.  Neurological: Negative for dizziness, tingling, focal weakness, seizures, weakness and headaches.  Endo/Heme/Allergies: Does not bruise/bleed easily.  Psychiatric/Behavioral: Negative for depression and suicidal ideas. The patient does not have insomnia.      No Known Allergies   Past Medical History:  Diagnosis Date  . Abnormal Pap smear of cervix    age 75  . History of Papanicolaou smear of cervix [id number]; 04/01/2011   neg; neg, ct/gc neg;   . Pica    craves flour  . Thrombocytopenia (HCC)   . UTI (urinary tract infection)      Past Surgical History:  Procedure Laterality Date  . COLPOSCOPY      Social History   Socioeconomic History  . Marital status: Married    Spouse name: Not on file  . Number of children: 4  . Years of education: 65  . Highest education level: Not on file  Occupational History  . Not on file  Social Needs  . Financial resource strain: Not on file  . Food insecurity:    Worry: Not on file    Inability: Not on file  . Transportation needs:    Medical: Not on file    Non-medical: Not on file  Tobacco  Use  . Smoking status: Former Smoker    Last attempt to quit: 03/22/2014    Years since quitting: 3.5  . Smokeless tobacco: Never Used  Substance and Sexual Activity  . Alcohol use: Yes  . Drug use: No  . Sexual activity: Yes    Birth control/protection: Other-see comments  Lifestyle  . Physical activity:    Days per week: Not on file     Minutes per session: Not on file  . Stress: Not on file  Relationships  . Social connections:    Talks on phone: Not on file    Gets together: Not on file    Attends religious service: Not on file    Active member of club or organization: Not on file    Attends meetings of clubs or organizations: Not on file    Relationship status: Not on file  . Intimate partner violence:    Fear of current or ex partner: Not on file    Emotionally abused: Not on file    Physically abused: Not on file    Forced sexual activity: Not on file  Other Topics Concern  . Not on file  Social History Narrative  . Not on file    Family History  Problem Relation Age of Onset  . Cancer Maternal Grandfather 60       lung, brain  . Hypertension Maternal Grandfather   . Skin cancer Maternal Grandfather   . Schizophrenia Brother   . Hearing loss Daughter        2/16; oldest daughter; low freq loss     Current Outpatient Medications:  .  Prenatal Vit-Fe Fumarate-FA (PRENATAL MULTIVITAMIN) TABS tablet, Take 1 tablet by mouth daily at 12 noon., Disp: , Rfl:   Physical exam: There were no vitals filed for this visit. Physical Exam  Constitutional: She is oriented to person, place, and time and well-developed, well-nourished, and in no distress.  HENT:  Head: Normocephalic and atraumatic.  Eyes: Pupils are equal, round, and reactive to light. EOM are normal.  Neck: Normal range of motion.  Cardiovascular: Normal rate, regular rhythm and normal heart sounds.  Pulmonary/Chest: Effort normal and breath sounds normal.  Abdominal: Soft. Bowel sounds are normal.  Gravid uterus  Neurological: She is alert and oriented to person, place, and time.  Skin: Skin is warm and dry.     CMP Latest Ref Rng & Units 09/20/2017  Glucose 65 - 99 mg/dL 86  BUN 6 - 20 mg/dL 5(L)  Creatinine 1.61 - 1.00 mg/dL 0.96  Sodium 045 - 409 mmol/L 133(L)  Potassium 3.5 - 5.1 mmol/L 3.6  Chloride 101 - 111 mmol/L 102  CO2 22 -  32 mmol/L 24  Calcium 8.9 - 10.3 mg/dL 8.2(L)  Total Protein 6.5 - 8.1 g/dL 7.0  Total Bilirubin 0.3 - 1.2 mg/dL 0.7  Alkaline Phos 38 - 126 U/L 51  AST 15 - 41 U/L 16  ALT 14 - 54 U/L 8(L)   CBC Latest Ref Rng & Units 09/20/2017  WBC 3.6 - 11.0 K/uL 6.7  Hemoglobin 12.0 - 16.0 g/dL 11.8(L)  Hematocrit 35.0 - 47.0 % 34.4(L)  Platelets 150 - 440 K/uL 101(L)    No images are attached to the encounter.  US Ob Follow Up  Result Date: 09/01/2017 ULTRASOUND REPORT Location: [facility] Date of Service: 08/31/2017 Indications:F/U Anatomy Findings: Mason Jim intrauterine pregnancy is visualized with FHR at 158 BPM. Fetal presentation is Cephalic. Placenta: Anterior, grade 1. AFI: subjectively normal. Anatomic  survey is complete. Impression: 1. 1965w4d Viable Singleton Intrauterine pregnancy previously established criteria. 2. Normal Anatomy Scan is now complete Recommendations: 1.Clinical correlation with the patient's History and Physical Exam. Willette AlmaKristen Priestley, RDMS, RVT  There is a singleton gestation with subjectively normal amniotic fluid volume. The visualized fetal anatomy appears within normal limits within the resolution of ultrasound as described above.  It must be noted that a normal ultrasound is unable to rule out fetal aneuploidy.  Vena AustriaAndreas Staebler, MD, Evern CoreFACOG [facility], [facility] 09/01/2017, 1:35 PM     Assessment and plan- Patient is a 27 y.o. female with following issues:  1. Thrombocytopenia- gestational thrombocytopenia versus ITP. Cbc done a week ago showed platelet count of 106. Her platelet count has dropped to 60's in prior pregnancies. Folate normal. Peripheral smear review shows unremarkable platelet morphology. No shistocytes. Continue to monitor  2. Normocytic anemia: hb 11.8 with low ferritin of 7 and elevated TIBC. b12 level low at 198. Discussed risks and benefits of venofer - 4 doses given over 4 weeks- including all but not limited to risk of  infusion reaction,headaches and leg swelling. Patient understands and agrees to proceed as planned. She would like to try po b12 before taking b12 shots.  rtc in 1 month with cbc with diff and b12 levels   I will see her back in 2 months with cbc with diff, ferritin and iron studies   Visit Diagnosis 1. Iron deficiency anemia, unspecified iron deficiency anemia type   2. B12 deficiency   3. Benign gestational thrombocytopenia, antepartum [facility](HCC)      Dr. Owens SharkArchana Jerrico Covello, MD, MPH [facility] Pager- [phone] 09/27/2017 3:42 PM

## 2017-09-27 NOTE — Patient Instructions (Signed)
Iron Sucrose injection What is this medicine? IRON SUCROSE (AHY ern SOO krohs) is an iron complex. Iron is used to make healthy red blood cells, which carry oxygen and nutrients throughout the body. This medicine is used to treat iron deficiency anemia in people with chronic kidney disease. This medicine may be used for other purposes; ask your health care provider or pharmacist if you have questions. COMMON BRAND NAME(S): Venofer What should I tell my health care provider before I take this medicine? They need to know if you have any of these conditions: -anemia not caused by low iron levels -heart disease -high levels of iron in the blood -kidney disease -liver disease -an unusual or allergic reaction to iron, other medicines, foods, dyes, or preservatives -pregnant or trying to get pregnant -breast-feeding How should I use this medicine? This medicine is for infusion into a vein. It is given by a health care professional in a hospital or clinic setting. Talk to your pediatrician regarding the use of this medicine in children. While this drug may be prescribed for children as young as 2 years for selected conditions, precautions do apply. Overdosage: If you think you have taken too much of this medicine contact a poison control center or emergency room at once. NOTE: This medicine is only for you. Do not share this medicine with others. What if I miss a dose? It is important not to miss your dose. Call your doctor or health care professional if you are unable to keep an appointment. What may interact with this medicine? Do not take this medicine with any of the following medications: -deferoxamine -dimercaprol -other iron products This medicine may also interact with the following medications: -chloramphenicol -deferasirox This list may not describe all possible interactions. Give your health care provider a list of all the medicines, herbs, non-prescription drugs, or dietary  supplements you use. Also tell them if you smoke, drink alcohol, or use illegal drugs. Some items may interact with your medicine. What should I watch for while using this medicine? Visit your doctor or healthcare professional regularly. Tell your doctor or healthcare professional if your symptoms do not start to get better or if they get worse. You may need blood work done while you are taking this medicine. You may need to follow a special diet. Talk to your doctor. Foods that contain iron include: whole grains/cereals, dried fruits, beans, or peas, leafy green vegetables, and organ meats (liver, kidney). What side effects may I notice from receiving this medicine? Side effects that you should report to your doctor or health care professional as soon as possible: -allergic reactions like skin rash, itching or hives, swelling of the face, lips, or tongue -breathing problems -changes in blood pressure -cough -fast, irregular heartbeat -feeling faint or lightheaded, falls -fever or chills -flushing, sweating, or hot feelings -joint or muscle aches/pains -seizures -swelling of the ankles or feet -unusually weak or tired Side effects that usually do not require medical attention (report to your doctor or health care professional if they continue or are bothersome): -diarrhea -feeling achy -headache -irritation at site where injected -nausea, vomiting -stomach upset -tiredness This list may not describe all possible side effects. Call your doctor for medical advice about side effects. You may report side effects to FDA at 1-800-FDA-1088. Where should I keep my medicine? This drug is given in a hospital or clinic and will not be stored at home. NOTE: This sheet is a summary. It may not cover all possible information. If   you have questions about this medicine, talk to your doctor, pharmacist, or health care provider.  2018 Elsevier/Gold Standard (2011-03-26 17:14:35)  

## 2017-09-27 NOTE — Progress Notes (Signed)
No new changes noted today 

## 2017-09-28 ENCOUNTER — Encounter: Payer: Self-pay | Admitting: Obstetrics and Gynecology

## 2017-09-28 ENCOUNTER — Other Ambulatory Visit: Payer: Medicaid Other

## 2017-09-28 ENCOUNTER — Ambulatory Visit (INDEPENDENT_AMBULATORY_CARE_PROVIDER_SITE_OTHER): Payer: Medicaid Other | Admitting: Obstetrics and Gynecology

## 2017-09-28 VITALS — BP 100/58 | Wt 153.0 lb

## 2017-09-28 DIAGNOSIS — O99119 Other diseases of the blood and blood-forming organs and certain disorders involving the immune mechanism complicating pregnancy, unspecified trimester: Secondary | ICD-10-CM

## 2017-09-28 DIAGNOSIS — Z3480 Encounter for supervision of other normal pregnancy, unspecified trimester: Secondary | ICD-10-CM

## 2017-09-28 DIAGNOSIS — D696 Thrombocytopenia, unspecified: Secondary | ICD-10-CM

## 2017-09-28 DIAGNOSIS — D508 Other iron deficiency anemias: Secondary | ICD-10-CM

## 2017-09-28 DIAGNOSIS — O99012 Anemia complicating pregnancy, second trimester: Secondary | ICD-10-CM

## 2017-09-28 DIAGNOSIS — Z862 Personal history of diseases of the blood and blood-forming organs and certain disorders involving the immune mechanism: Secondary | ICD-10-CM | POA: Insufficient documentation

## 2017-09-28 DIAGNOSIS — Z3A27 27 weeks gestation of pregnancy: Secondary | ICD-10-CM

## 2017-09-28 HISTORY — DX: Personal history of diseases of the blood and blood-forming organs and certain disorders involving the immune mechanism: Z86.2

## 2017-09-28 HISTORY — DX: Other diseases of the blood and blood-forming organs and certain disorders involving the immune mechanism complicating pregnancy, unspecified trimester: D69.6

## 2017-09-28 NOTE — Progress Notes (Signed)
ROB- 1 hr GTT 

## 2017-09-28 NOTE — Progress Notes (Signed)
Routine Prenatal Care Visit  Subjective  Carolyn Chavez is a 27 y.o. G5P4004 at [redacted]w[redacted]d being seen today for ongoing prenatal care.  She is currently monitored for the following issues for this high-risk pregnancy and has Supervision of other normal pregnancy, antepartum; Anemia affecting pregnancy in second trimester; Iron deficiency anemia; Thrombocytopenia affecting pregnancy, antepartum (HCC); and suspected ITP on their problem list.  ----------------------------------------------------------------------------------- Patient reports no complaints.   Contractions: Not present. Vag. Bleeding: None.  Movement: Present. Denies leaking of fluid.  ----------------------------------------------------------------------------------- The following portions of the patient's history were reviewed and updated as appropriate: allergies, current medications, past family history, past medical history, past social history, past surgical history and problem list. Problem list updated.   Objective  Blood pressure (!) 100/58, weight 153 lb (69.4 kg), last menstrual period 03/19/2017, unknown if currently breastfeeding. Pregravid weight 126 lb (57.2 kg) Total Weight Gain 27 lb (12.2 kg) Urinalysis: Urine Protein: Negative Urine Glucose: Negative  Fetal Status: Fetal Heart Rate (bpm): 145 Fundal Height: 28 cm Movement: Present     General:  Alert, oriented and cooperative. Patient is in no acute distress.  Skin: Skin is warm and dry. No rash noted.   Cardiovascular: Normal heart rate noted  Respiratory: Normal respiratory effort, no problems with respiration noted  Abdomen: Soft, gravid, appropriate for gestational age. Pain/Pressure: Absent     Pelvic:  Cervical exam deferred        Extremities: Normal range of motion.  Edema: None  ental Status: Normal mood and affect. Normal behavior. Normal judgment and thought content.     Assessment   27 y.o. [id number] at [redacted]w[redacted]d by  12/24/2017, by Ultrasound  presenting for routine prenatal visit  Plan   FIFTH Problems (from 05/25/17 to present)    Problem Noted Resolved   Thrombocytopenia affecting pregnancy, antepartum (HCC) 09/28/2017 by Natale Milch, MD No   Overview Signed 09/28/2017 12:32 PM by Natale Milch, MD    Suspected ITP, will need retest of plt count 1-3 months postpartum       Supervision of other normal pregnancy, antepartum 05/25/2017 by Oswaldo Conroy, CNM No   Overview Addendum 09/28/2017 12:32 PM by Natale Milch, MD    Clinic [facility] Prenatal Labs  Dating LMP unsure, dating by 9 week [place] Blood type: O/Positive/-- (11/27 1039)   Genetic Screen 1 Screen: Negative, msAFP - declined   Antibody:Negative (11/27 1039)  Anatomic [place] IComplete Rubella: 3.55 (11/27 1039) Varicella:    GTT Third trimester:  RPR: Non Reactive (11/27 1039)   Rhogam n/a HBsAg: Negative (11/27 1039)   TDaP vaccine                        Flu Shot: declines HIV: NR  Baby Food  breast                              GBS:   Contraception  Given information Pap: 04/2017 NILM  CBB  Given information   CS/VBAC Not applicable   Support Person                 Gestational age appropriate obstetric precautions including but not limited to vaginal bleeding, contractions, leaking of fluid and fetal movement were reviewed in detail with the patient.    1GTT today and 28 week labs Discussed birth control options Given information on cord blood banking Discussed breast feeding  and lactation consultants at the hospital  Patient has been following with hematology for suspected immune thrombocytopenia. Will need to monitor platelets in final weeks of pregnancy to manage patient appropriately. Weekly platelet counts after 35 weeks. Ideal treatment should be initiated to keep platelets above 30 x 10^9/L and higher (50-70 x 10^9) for epidural of cesarean if needed.   Please see below for a portion of the guidelines from ACOG regarding  management of ITP during pregnancy.   Return in about 2 weeks (around 10/12/2017) for ROB.  Adelene Idler MD [facility], [facility] 09/28/2017, 12:22 PM   From ACOG Practice Bulletin [phone]  When should women with immune thrombocytopenia receive medical therapy?  The goal of medical therapy during pregnancy in women with ITP is to minimize the risk of bleeding complications that can occur with regional anesthesia and delivery associated with thrombocytopenia. Because the platelet function of these patients usually is normal, it is not necessary to maintain their counts in the normal range. Current consensus guidelines recommend that, except for the delivery period, treatment indications for pregnant women are similar to those currently recommended for any patient (8, 36). Recommendations for the management of ITP in pregnancy mainly are based on clinical experience and expert consensus. No evidence for a specific platelet threshold at which pregnant patients with ITP should be treated is available (36). Treatment should be initiated when the patient has symptomatic bleeding, when platelet counts fall below 30 x 109/L, or to increase platelet counts to a level considered safe for procedures (8). At the time of delivery, management of ITP is based on an assessment of maternal bleeding risks associated with delivery, epidural anesthesia, and the minimum platelet counts recommended to undergo these procedures (70 x 109/L for epidural placement and 50 x 109/L for cesarean delivery) (30, 36, 37). What therapy should be used to treat immune thrombocytopenia during pregnancy? Corticosteroids or intravenous immunoglobulin (IVIG), or both, is the first-line treatment for maternal ITP (8, 36). Although either approach is acceptable, expert opinion recommends corticosteroids as the standard initial treatment for courses up to 21 days (8, 36). Treatment should be adapted to the individual  patient, taking into account the occurrence and severity of bleeding, the speed of desired platelet count increase, and possible adverse effects. There is no evidence to guide a sequence of treatments for patients who have recurrent or persistent thrombocytopenia associated with bleeding after an initial treatment course (36).  Prednisone at a dosage of 0.5-2 mg/kg daily has been recommended as the initial treatment for ITP in adults (8, 36). Although there are few data to distinguish management of ITP in pregnant and nonpregnant women, the consensus recommendations in pregnancy are for prednisone to be given initially at a low dosage (10-20 mg/day) and then adjusted to the minimum dose that produces an adequate increase in the platelet count (8). An initial response usually occurs within 4-14 days and reaches a peak response within 1-4 weeks (16). It is recommended that corticosteroids be given for at least 21 days then tapered (36) until reaching the lowest dose required to maintain a platelet count that prevents major bleeding.  Intravenous immunoglobulin is appropriate therapy for cases of immune thrombocytopenia refractory to corticosteroids when significant adverse effects occur with corticosteroids or a more rapid platelet increase is necessary. Intravenous immunoglobulin should be given initially at 1 g/kg as a one-time dose, but may be repeated if necessary (36). Initial response usually occurs within 1-3 days and a peak response usually  is reached within 2-7 days (16). Treatment with IVIG is costly and of limited availability. When considering use of IVIG, it is prudent to seek consultation from a physician experienced in such cases. Splenectomy is a management option for patients with ITP who fail first-line treatment (8). Splenectomy remains the only therapy that provides prolonged remission at 1 year and longer in a high fraction of patients with ITP (36). The procedure usually is avoided during  pregnancy because of fetal risks and technical difficulties late in gestation. However, splenectomy can be accomplished safely during pregnancy if necessary, ideally in the second trimester. Data regarding the extent of the risks, as well as the ideal type of surgical approach (open versus laparoscopic), are lacking (36).  Platelet transfusions should be used only as a temporary measure to control life-threatening hemorrhage or to prepare a patient for urgent surgery. A larger-than-usual dose (twofold to threefold) of platelets should be infused with intravenous high-dose corticosteroids or IVIG ranging from every 30 minutes to 8 hours (8, 36). The effect on the platelet count appears to be short lived (36). Other therapeutic options used to treat ITP, such as cytotoxic agents (cyclophosphamide or vinca alkaloids), Rh D immunoglobulin, or immunosuppressive agents (azathioprine or rituximab), have not been adequately evaluated during pregnancy and may have potential adverse fetal effects (8, 36). Although antifibrinolytic agents (such as amniocaproic and tranexamic acid) have been discussed in case reports as adjunct treatment for bleeding in thrombocytopenic patients, their efficacy is unproved (36).  What additional specialized care should women with immune thrombocytopenia receive? Little specialized care generally is required for asymptomatic pregnant women with ITP. Expert opinion suggests that serial assessment of the maternal platelet count should be done every trimester in asymptomatic women in remission and more frequently in individuals with thrombocytopenia (7). Pregnant women with ITP should be instructed to avoid nonsteroidal antiinflammatory agents, salicylates, and possible trauma (8). Although there may be instances in which oral antiplatelet medication is recommended (such as low-dose aspirin therapy to reduce the risk of preeclampsia), no data is currently available for guidance in women with  known thrombocytopenic conditions. The patient who has had a splenectomy should be immunized against pneumococcus, Haemophilus influenzae, and meningococcus. If the diagnosis of ITP is made, consultation and ongoing evaluation with a physician experienced in such matters are appropriate.

## 2017-09-29 LAB — 28 WEEK RH+PANEL
Basophils Absolute: 0 10*3/uL (ref 0.0–0.2)
Basos: 0 %
EOS (ABSOLUTE): 0.2 10*3/uL (ref 0.0–0.4)
Eos: 2 %
Gestational Diabetes Screen: 125 mg/dL (ref 65–139)
HIV Screen 4th Generation wRfx: NONREACTIVE
Hematocrit: 31.6 % — ABNORMAL LOW (ref 34.0–46.6)
Hemoglobin: 10.4 g/dL — ABNORMAL LOW (ref 11.1–15.9)
IMMATURE GRANS (ABS): 0.1 10*3/uL (ref 0.0–0.1)
IMMATURE GRANULOCYTES: 1 %
Lymphocytes Absolute: 1.1 10*3/uL (ref 0.7–3.1)
Lymphs: 15 %
MCH: 30.8 pg (ref 26.6–33.0)
MCHC: 32.9 g/dL (ref 31.5–35.7)
MCV: 94 fL (ref 79–97)
MONOS ABS: 0.3 10*3/uL (ref 0.1–0.9)
Monocytes: 4 %
NEUTROS PCT: 78 %
Neutrophils Absolute: 5.5 10*3/uL (ref 1.4–7.0)
PLATELETS: 102 10*3/uL — AB (ref 150–379)
RBC: 3.38 x10E6/uL — AB (ref 3.77–5.28)
RDW: 14 % (ref 12.3–15.4)
RPR: NONREACTIVE
WBC: 7 10*3/uL (ref 3.4–10.8)

## 2017-10-04 ENCOUNTER — Inpatient Hospital Stay: Payer: Medicaid Other

## 2017-10-04 ENCOUNTER — Telehealth: Payer: Self-pay

## 2017-10-04 VITALS — BP 113/70 | HR 81 | Temp 96.3°F | Resp 18

## 2017-10-04 DIAGNOSIS — D696 Thrombocytopenia, unspecified: Secondary | ICD-10-CM | POA: Diagnosis not present

## 2017-10-04 DIAGNOSIS — D509 Iron deficiency anemia, unspecified: Secondary | ICD-10-CM

## 2017-10-04 MED ORDER — SODIUM CHLORIDE 0.9 % IV SOLN
Freq: Once | INTRAVENOUS | Status: AC
  Administered 2017-10-04: 09:00:00 via INTRAVENOUS
  Filled 2017-10-04: qty 1000

## 2017-10-04 MED ORDER — IRON SUCROSE 20 MG/ML IV SOLN
200.0000 mg | INTRAVENOUS | Status: DC
  Administered 2017-10-04: 200 mg via INTRAVENOUS
  Filled 2017-10-04: qty 10

## 2017-10-04 NOTE — Patient Instructions (Signed)
Iron Sucrose injection What is this medicine? IRON SUCROSE (AHY ern SOO krohs) is an iron complex. Iron is used to make healthy red blood cells, which carry oxygen and nutrients throughout the body. This medicine is used to treat iron deficiency anemia in people with chronic kidney disease. This medicine may be used for other purposes; ask your health care provider or pharmacist if you have questions. COMMON BRAND NAME(S): Venofer What should I tell my health care provider before I take this medicine? They need to know if you have any of these conditions: -anemia not caused by low iron levels -heart disease -high levels of iron in the blood -kidney disease -liver disease -an unusual or allergic reaction to iron, other medicines, foods, dyes, or preservatives -pregnant or trying to get pregnant -breast-feeding How should I use this medicine? This medicine is for infusion into a vein. It is given by a health care professional in a hospital or clinic setting. Talk to your pediatrician regarding the use of this medicine in children. While this drug may be prescribed for children as young as 2 years for selected conditions, precautions do apply. Overdosage: If you think you have taken too much of this medicine contact a poison control center or emergency room at once. NOTE: This medicine is only for you. Do not share this medicine with others. What if I miss a dose? It is important not to miss your dose. Call your doctor or health care professional if you are unable to keep an appointment. What may interact with this medicine? Do not take this medicine with any of the following medications: -deferoxamine -dimercaprol -other iron products This medicine may also interact with the following medications: -chloramphenicol -deferasirox This list may not describe all possible interactions. Give your health care provider a list of all the medicines, herbs, non-prescription drugs, or dietary  supplements you use. Also tell them if you smoke, drink alcohol, or use illegal drugs. Some items may interact with your medicine. What should I watch for while using this medicine? Visit your doctor or healthcare professional regularly. Tell your doctor or healthcare professional if your symptoms do not start to get better or if they get worse. You may need blood work done while you are taking this medicine. You may need to follow a special diet. Talk to your doctor. Foods that contain iron include: whole grains/cereals, dried fruits, beans, or peas, leafy green vegetables, and organ meats (liver, kidney). What side effects may I notice from receiving this medicine? Side effects that you should report to your doctor or health care professional as soon as possible: -allergic reactions like skin rash, itching or hives, swelling of the face, lips, or tongue -breathing problems -changes in blood pressure -cough -fast, irregular heartbeat -feeling faint or lightheaded, falls -fever or chills -flushing, sweating, or hot feelings -joint or muscle aches/pains -seizures -swelling of the ankles or feet -unusually weak or tired Side effects that usually do not require medical attention (report to your doctor or health care professional if they continue or are bothersome): -diarrhea -feeling achy -headache -irritation at site where injected -nausea, vomiting -stomach upset -tiredness This list may not describe all possible side effects. Call your doctor for medical advice about side effects. You may report side effects to FDA at 1-800-FDA-1088. Where should I keep my medicine? This drug is given in a hospital or clinic and will not be stored at home. NOTE: This sheet is a summary. It may not cover all possible information. If   you have questions about this medicine, talk to your doctor, pharmacist, or health care provider.  2018 Elsevier/Gold Standard (2011-03-26 17:14:35)  

## 2017-10-04 NOTE — Telephone Encounter (Signed)
Pt called and would like to speak with provider about test results.

## 2017-10-11 ENCOUNTER — Inpatient Hospital Stay: Payer: Medicaid Other

## 2017-10-11 VITALS — BP 120/73 | HR 69 | Temp 95.2°F | Resp 18

## 2017-10-11 DIAGNOSIS — D696 Thrombocytopenia, unspecified: Secondary | ICD-10-CM | POA: Diagnosis not present

## 2017-10-11 DIAGNOSIS — D509 Iron deficiency anemia, unspecified: Secondary | ICD-10-CM

## 2017-10-11 MED ORDER — SODIUM CHLORIDE 0.9 % IV SOLN
Freq: Once | INTRAVENOUS | Status: AC
  Administered 2017-10-11: 09:00:00 via INTRAVENOUS
  Filled 2017-10-11: qty 1000

## 2017-10-11 MED ORDER — IRON SUCROSE 20 MG/ML IV SOLN
200.0000 mg | INTRAVENOUS | Status: DC
  Administered 2017-10-11: 200 mg via INTRAVENOUS
  Filled 2017-10-11: qty 10

## 2017-10-11 NOTE — Patient Instructions (Signed)
Iron Sucrose injection What is this medicine? IRON SUCROSE (AHY ern SOO krohs) is an iron complex. Iron is used to make healthy red blood cells, which carry oxygen and nutrients throughout the body. This medicine is used to treat iron deficiency anemia in people with chronic kidney disease. This medicine may be used for other purposes; ask your health care provider or pharmacist if you have questions. COMMON BRAND NAME(S): Venofer What should I tell my health care provider before I take this medicine? They need to know if you have any of these conditions: -anemia not caused by low iron levels -heart disease -high levels of iron in the blood -kidney disease -liver disease -an unusual or allergic reaction to iron, other medicines, foods, dyes, or preservatives -pregnant or trying to get pregnant -breast-feeding How should I use this medicine? This medicine is for infusion into a vein. It is given by a health care professional in a hospital or clinic setting. Talk to your pediatrician regarding the use of this medicine in children. While this drug may be prescribed for children as young as 2 years for selected conditions, precautions do apply. Overdosage: If you think you have taken too much of this medicine contact a poison control center or emergency room at once. NOTE: This medicine is only for you. Do not share this medicine with others. What if I miss a dose? It is important not to miss your dose. Call your doctor or health care professional if you are unable to keep an appointment. What may interact with this medicine? Do not take this medicine with any of the following medications: -deferoxamine -dimercaprol -other iron products This medicine may also interact with the following medications: -chloramphenicol -deferasirox This list may not describe all possible interactions. Give your health care provider a list of all the medicines, herbs, non-prescription drugs, or dietary  supplements you use. Also tell them if you smoke, drink alcohol, or use illegal drugs. Some items may interact with your medicine. What should I watch for while using this medicine? Visit your doctor or healthcare professional regularly. Tell your doctor or healthcare professional if your symptoms do not start to get better or if they get worse. You may need blood work done while you are taking this medicine. You may need to follow a special diet. Talk to your doctor. Foods that contain iron include: whole grains/cereals, dried fruits, beans, or peas, leafy green vegetables, and organ meats (liver, kidney). What side effects may I notice from receiving this medicine? Side effects that you should report to your doctor or health care professional as soon as possible: -allergic reactions like skin rash, itching or hives, swelling of the face, lips, or tongue -breathing problems -changes in blood pressure -cough -fast, irregular heartbeat -feeling faint or lightheaded, falls -fever or chills -flushing, sweating, or hot feelings -joint or muscle aches/pains -seizures -swelling of the ankles or feet -unusually weak or tired Side effects that usually do not require medical attention (report to your doctor or health care professional if they continue or are bothersome): -diarrhea -feeling achy -headache -irritation at site where injected -nausea, vomiting -stomach upset -tiredness This list may not describe all possible side effects. Call your doctor for medical advice about side effects. You may report side effects to FDA at 1-800-FDA-1088. Where should I keep my medicine? This drug is given in a hospital or clinic and will not be stored at home. NOTE: This sheet is a summary. It may not cover all possible information. If   you have questions about this medicine, talk to your doctor, pharmacist, or health care provider.  2018 Elsevier/Gold Standard (2011-03-26 17:14:35)  

## 2017-10-12 ENCOUNTER — Ambulatory Visit (INDEPENDENT_AMBULATORY_CARE_PROVIDER_SITE_OTHER): Payer: Medicaid Other | Admitting: Obstetrics and Gynecology

## 2017-10-12 VITALS — BP 100/52 | Wt 152.0 lb

## 2017-10-12 DIAGNOSIS — O0990 Supervision of high risk pregnancy, unspecified, unspecified trimester: Secondary | ICD-10-CM

## 2017-10-12 DIAGNOSIS — O99012 Anemia complicating pregnancy, second trimester: Secondary | ICD-10-CM

## 2017-10-12 DIAGNOSIS — Z3A29 29 weeks gestation of pregnancy: Secondary | ICD-10-CM

## 2017-10-12 NOTE — Progress Notes (Signed)
ROB

## 2017-10-12 NOTE — Progress Notes (Signed)
Routine Prenatal Care Visit  Subjective  Carolyn Chavez is a 27 y.o. G5P4004 at 7162w4d being seen today for ongoing prenatal care.  She is currently monitored for the following issues for this high-risk pregnancy and has Supervision of high risk pregnancy, antepartum; Anemia affecting pregnancy in second trimester; Iron deficiency anemia; Thrombocytopenia affecting pregnancy, antepartum (HCC); and History of ITP on their problem list.  ----------------------------------------------------------------------------------- Patient reports no complaints.   Contractions: Not present. Vag. Bleeding: None.  Movement: Present. Denies leaking of fluid.  ----------------------------------------------------------------------------------- The following portions of the patient's history were reviewed and updated as appropriate: allergies, current medications, past family history, past medical history, past social history, past surgical history and problem list. Problem list updated.   Objective  Blood pressure (!) 100/52, weight 152 lb (68.9 kg), last menstrual period 03/19/2017, unknown if currently breastfeeding. Pregravid weight 126 lb (57.2 kg) Total Weight Gain 26 lb (11.8 kg) Urinalysis: Urine Protein: Negative Urine Glucose: Negative  Fetal Status: Fetal Heart Rate (bpm): 145 Fundal Height: 29 cm Movement: Present     General:  Alert, oriented and cooperative. Patient is in no acute distress.  Skin: Skin is warm and dry. No rash noted.   Cardiovascular: Normal heart rate noted  Respiratory: Normal respiratory effort, no problems with respiration noted  Abdomen: Soft, gravid, appropriate for gestational age. Pain/Pressure: Absent     Pelvic:  Cervical exam deferred        Extremities: Normal range of motion.     ental Status: Normal mood and affect. Normal behavior. Normal judgment and thought content.     Assessment   27 y.o. [id number] at 6362w4d by  12/24/2017, by Ultrasound presenting for  routine prenatal visit  Plan   FIFTH Problems (from 05/25/17 to present)    Problem Noted Resolved   Thrombocytopenia affecting pregnancy, antepartum (HCC) 09/28/2017 by Carolyn Chavez, Carolyn R, MD No   Overview Signed 09/28/2017 12:32 PM by Carolyn Chavez, Carolyn R, MD    Suspected ITP, will need retest of plt count 1-3 months postpartum       Supervision of high risk pregnancy, antepartum 05/25/2017 by Carolyn Chavez, Carolyn Y, CNM No   Overview Addendum 10/04/2017 11:06 AM by Carolyn Chavez, Carolyn Y, CNM    [facility]inic [facility] Prenatal Labs  Dating LMP unsure, dating by 9 week US Blood type: O/Positive/-- (11/27 1039)   Genetic Screen 1 Screen: Negative, msAFP - declined   Antibody:Negative (11/27 1039)  Anatomic US IComplete Rubella: 3.55 (11/27 1039) Varicella: Immune  GTT Third trimester: 125 RPR: Non Reactive (11/27 1039)   Rhogam n/a HBsAg: Negative (11/27 1039)   TDaP vaccine                        Flu Shot: declines HIV: NR  Baby Food  breast                              GBS:   Contraception  Given information Pap: 04/2017 NILM  CBB  Given information   CS/VBAC Not applicable   Support Person                  Gestational age appropriate obstetric precautions including but not limited to vaginal bleeding, contractions, leaking of fluid and fetal movement were reviewed in detail with the patient.   - TDAP next visit discussed  Return in about 2 weeks (around 10/26/2017) for ROB.  Carolyn AustriaAndreas Lynsey Ange, MD,  [facility], [facility] 10/12/2017, 9:20 AM

## 2017-10-18 ENCOUNTER — Other Ambulatory Visit: Payer: Medicaid Other

## 2017-10-18 ENCOUNTER — Inpatient Hospital Stay: Payer: Medicaid Other

## 2017-10-18 ENCOUNTER — Ambulatory Visit: Payer: Medicaid Other | Admitting: Oncology

## 2017-10-18 VITALS — BP 96/57 | HR 81 | Temp 96.7°F | Resp 18

## 2017-10-18 DIAGNOSIS — O99012 Anemia complicating pregnancy, second trimester: Secondary | ICD-10-CM

## 2017-10-18 DIAGNOSIS — D696 Thrombocytopenia, unspecified: Secondary | ICD-10-CM | POA: Diagnosis not present

## 2017-10-18 DIAGNOSIS — D509 Iron deficiency anemia, unspecified: Secondary | ICD-10-CM

## 2017-10-18 DIAGNOSIS — O99112 Other diseases of the blood and blood-forming organs and certain disorders involving the immune mechanism complicating pregnancy, second trimester: Principal | ICD-10-CM

## 2017-10-18 DIAGNOSIS — D649 Anemia, unspecified: Secondary | ICD-10-CM

## 2017-10-18 LAB — CBC WITH DIFFERENTIAL/PLATELET
BASOS ABS: 0 10*3/uL (ref 0–0.1)
BASOS PCT: 0 %
Eosinophils Absolute: 0.1 10*3/uL (ref 0–0.7)
Eosinophils Relative: 2 %
HEMATOCRIT: 34.3 % — AB (ref 35.0–47.0)
HEMOGLOBIN: 11.7 g/dL — AB (ref 12.0–16.0)
Lymphocytes Relative: 17 %
Lymphs Abs: 1.1 10*3/uL (ref 1.0–3.6)
MCH: 32.4 pg (ref 26.0–34.0)
MCHC: 34 g/dL (ref 32.0–36.0)
MCV: 95.4 fL (ref 80.0–100.0)
MONO ABS: 0.4 10*3/uL (ref 0.2–0.9)
Monocytes Relative: 6 %
NEUTROS ABS: 5.1 10*3/uL (ref 1.4–6.5)
NEUTROS PCT: 75 %
Platelets: 83 10*3/uL — ABNORMAL LOW (ref 150–440)
RBC: 3.6 MIL/uL — ABNORMAL LOW (ref 3.80–5.20)
RDW: 14.1 % (ref 11.5–14.5)
WBC: 6.8 10*3/uL (ref 3.6–11.0)

## 2017-10-18 MED ORDER — IRON SUCROSE 20 MG/ML IV SOLN
200.0000 mg | INTRAVENOUS | Status: DC
  Administered 2017-10-18: 200 mg via INTRAVENOUS
  Filled 2017-10-18: qty 10

## 2017-10-18 MED ORDER — SODIUM CHLORIDE 0.9 % IV SOLN
Freq: Once | INTRAVENOUS | Status: AC
  Administered 2017-10-18: 09:00:00 via INTRAVENOUS
  Filled 2017-10-18: qty 1000

## 2017-10-18 NOTE — Patient Instructions (Signed)
Iron Sucrose injection What is this medicine? IRON SUCROSE (AHY ern SOO krohs) is an iron complex. Iron is used to make healthy red blood cells, which carry oxygen and nutrients throughout the body. This medicine is used to treat iron deficiency anemia in people with chronic kidney disease. This medicine may be used for other purposes; ask your health care provider or pharmacist if you have questions. COMMON BRAND NAME(S): Venofer What should I tell my health care provider before I take this medicine? They need to know if you have any of these conditions: -anemia not caused by low iron levels -heart disease -high levels of iron in the blood -kidney disease -liver disease -an unusual or allergic reaction to iron, other medicines, foods, dyes, or preservatives -pregnant or trying to get pregnant -breast-feeding How should I use this medicine? This medicine is for infusion into a vein. It is given by a health care professional in a hospital or clinic setting. Talk to your pediatrician regarding the use of this medicine in children. While this drug may be prescribed for children as young as 2 years for selected conditions, precautions do apply. Overdosage: If you think you have taken too much of this medicine contact a poison control center or emergency room at once. NOTE: This medicine is only for you. Do not share this medicine with others. What if I miss a dose? It is important not to miss your dose. Call your doctor or health care professional if you are unable to keep an appointment. What may interact with this medicine? Do not take this medicine with any of the following medications: -deferoxamine -dimercaprol -other iron products This medicine may also interact with the following medications: -chloramphenicol -deferasirox This list may not describe all possible interactions. Give your health care provider a list of all the medicines, herbs, non-prescription drugs, or dietary  supplements you use. Also tell them if you smoke, drink alcohol, or use illegal drugs. Some items may interact with your medicine. What should I watch for while using this medicine? Visit your doctor or healthcare professional regularly. Tell your doctor or healthcare professional if your symptoms do not start to get better or if they get worse. You may need blood work done while you are taking this medicine. You may need to follow a special diet. Talk to your doctor. Foods that contain iron include: whole grains/cereals, dried fruits, beans, or peas, leafy green vegetables, and organ meats (liver, kidney). What side effects may I notice from receiving this medicine? Side effects that you should report to your doctor or health care professional as soon as possible: -allergic reactions like skin rash, itching or hives, swelling of the face, lips, or tongue -breathing problems -changes in blood pressure -cough -fast, irregular heartbeat -feeling faint or lightheaded, falls -fever or chills -flushing, sweating, or hot feelings -joint or muscle aches/pains -seizures -swelling of the ankles or feet -unusually weak or tired Side effects that usually do not require medical attention (report to your doctor or health care professional if they continue or are bothersome): -diarrhea -feeling achy -headache -irritation at site where injected -nausea, vomiting -stomach upset -tiredness This list may not describe all possible side effects. Call your doctor for medical advice about side effects. You may report side effects to FDA at 1-800-FDA-1088. Where should I keep my medicine? This drug is given in a hospital or clinic and will not be stored at home. NOTE: This sheet is a summary. It may not cover all possible information. If   you have questions about this medicine, talk to your doctor, pharmacist, or health care provider.  2018 Elsevier/Gold Standard (2011-03-26 17:14:35)  

## 2017-10-26 ENCOUNTER — Ambulatory Visit (INDEPENDENT_AMBULATORY_CARE_PROVIDER_SITE_OTHER): Payer: Medicaid Other | Admitting: Obstetrics and Gynecology

## 2017-10-26 ENCOUNTER — Encounter: Payer: Self-pay | Admitting: Obstetrics and Gynecology

## 2017-10-26 VITALS — BP 110/62 | Wt 154.0 lb

## 2017-10-26 DIAGNOSIS — D696 Thrombocytopenia, unspecified: Secondary | ICD-10-CM

## 2017-10-26 DIAGNOSIS — Z3A31 31 weeks gestation of pregnancy: Secondary | ICD-10-CM

## 2017-10-26 DIAGNOSIS — O99119 Other diseases of the blood and blood-forming organs and certain disorders involving the immune mechanism complicating pregnancy, unspecified trimester: Secondary | ICD-10-CM

## 2017-10-26 DIAGNOSIS — Z862 Personal history of diseases of the blood and blood-forming organs and certain disorders involving the immune mechanism: Secondary | ICD-10-CM

## 2017-10-26 DIAGNOSIS — D508 Other iron deficiency anemias: Secondary | ICD-10-CM

## 2017-10-26 DIAGNOSIS — O0990 Supervision of high risk pregnancy, unspecified, unspecified trimester: Secondary | ICD-10-CM

## 2017-10-26 NOTE — Progress Notes (Incomplete)
Routine Prenatal Care Visit  Subjective  Carolyn Chavez is a 27 y.o. G5P4004 at [redacted]w[redacted]d being seen today for ongoing prenatal care.  She is currently monitored for the following issues for this {Blank single:19197::"high-risk","low-risk"} pregnancy and has Supervision of high risk pregnancy, antepartum; Anemia affecting pregnancy in second trimester; Iron deficiency anemia; Thrombocytopenia affecting pregnancy, antepartum (HCC); and History of ITP on their problem list.  ----------------------------------------------------------------------------------- Patient reports {sx:[id number]}.   Contractions: Not present. Vag. Bleeding: None.  Movement: Present. Denies leaking of fluid.  ----------------------------------------------------------------------------------- The following portions of the patient's history were reviewed and updated as appropriate: allergies, current medications, past family history, past medical history, past social history, past surgical history and problem list. Problem list updated.   Objective  Blood pressure 110/62, weight 154 lb (69.9 kg), last menstrual period 03/19/2017, unknown if currently breastfeeding. Pregravid weight 126 lb (57.2 kg) Total Weight Gain 28 lb (12.7 kg) Urinalysis: Urine Protein: Negative Urine Glucose: Negative  Fetal Status: Fetal Heart Rate (bpm): 145 Fundal Height: 32 cm Movement: Present     General:  Alert, oriented and cooperative. Patient is in no acute distress.  Skin: Skin is warm and dry. No rash noted.   Cardiovascular: Normal heart rate noted  Respiratory: Normal respiratory effort, no problems with respiration noted  Abdomen: Soft, gravid, appropriate for gestational age. Pain/Pressure: Absent     Pelvic:  {Blank single:19197::"Cervical exam performed","Cervical exam deferred"}        Extremities: Normal range of motion.  Edema: None  Mental Status: Normal mood and affect. Normal behavior. Normal judgment and thought content.    Assessment   27 y.o. [id number] at [redacted]w[redacted]d by  12/24/2017, by Ultrasound presenting for {Blank single:19197::"routine","work-in"} prenatal visit  Plan   FIFTH Problems (from 05/25/17 to present)    Problem Noted Resolved   Thrombocytopenia affecting pregnancy, antepartum (HCC) 09/28/2017 by Natale Milch, MD No   Overview Signed 09/28/2017 12:32 PM by Natale Milch, MD    Suspected ITP, will need retest of plt count 1-3 months postpartum       Supervision of high risk pregnancy, antepartum 05/25/2017 by Oswaldo Conroy, CNM No   Overview Addendum 10/04/2017 11:06 AM by Oswaldo Conroy, CNM    [facility] Prenatal Labs  Dating LMP unsure, dating by 9 week [place] Blood type: O/Positive/-- (11/27 1039)   Genetic Screen 1 Screen: Negative, msAFP - declined   Antibody:Negative (11/27 1039)  Anatomic [place] IComplete Rubella: 3.55 (11/27 1039) Varicella: Immune  GTT Third trimester: 125 RPR: Non Reactive (11/27 1039)   Rhogam n/a HBsAg: Negative (11/27 1039)   TDaP vaccine                        Flu Shot: declines HIV: NR  Baby Food  breast                              GBS:   Contraception  Given information Pap: 04/2017 NILM  CBB  Given information   CS/VBAC Not applicable   Support Person                  {Blank single:19197::"Term","Preterm"} labor symptoms and general obstetric precautions including but not limited to vaginal bleeding, contractions, leaking of fluid and fetal movement were reviewed in detail with the patient. Please refer to After Visit Summary for other counseling recommendations.   Return in about 2 weeks (around 11/09/2017)  for Routine Prenatal Appointment.  Thomasene Mohair, MD, [name], [facility] 10/26/2017 10:23 AM

## 2017-10-26 NOTE — Progress Notes (Signed)
Routine Prenatal Care Visit  Subjective  Carolyn Chavez is a 27 y.o. G5P4004 at [redacted]w[redacted]d being seen today for ongoing prenatal care.  She is currently monitored for the following issues for this high-risk pregnancy and has Supervision of high risk pregnancy, antepartum; Anemia affecting pregnancy in second trimester; Iron deficiency anemia; Thrombocytopenia affecting pregnancy, antepartum (HCC); and History of ITP on their problem list.  ----------------------------------------------------------------------------------- Patient reports: nose bleeds last few days.   Contractions: Not present. Vag. Bleeding: None.  Movement: Present. Denies leaking of fluid.  ----------------------------------------------------------------------------------- The following portions of the patient's history were reviewed and updated as appropriate: allergies, current medications, past family history, past medical history, past social history, past surgical history and problem list. Problem list updated.  Objective  Blood pressure 110/62, weight 154 lb (69.9 kg), last menstrual period 03/19/2017, unknown if currently breastfeeding. Pregravid weight 126 lb (57.2 kg) Total Weight Gain 28 lb (12.7 kg) Urinalysis: Urine Protein: Negative Urine Glucose: Negative  Fetal Status: Fetal Heart Rate (bpm): 145 Fundal Height: 32 cm Movement: Present     General:  Alert, oriented and cooperative. Patient is in no acute distress.  Skin: Skin is warm and dry. No rash noted.   Cardiovascular: Normal heart rate noted  Respiratory: Normal respiratory effort, no problems with respiration noted  Abdomen: Soft, gravid, appropriate for gestational age. Pain/Pressure: Absent     Pelvic:  Cervical exam deferred        Extremities: Normal range of motion.  Edema: None  Mental Status: Normal mood and affect. Normal behavior. Normal judgment and thought content.   Assessment   27 y.o. [id number] at [redacted]w[redacted]d by  12/24/2017, by Ultrasound  presenting for routine prenatal visit  Plan   FIFTH Problems (from 05/25/17 to present)    Problem Noted Resolved   Thrombocytopenia affecting pregnancy, antepartum (HCC) 09/28/2017 by Natale Milch, MD No   Overview Signed 09/28/2017 12:32 PM by Natale Milch, MD    Suspected ITP, will need retest of plt count 1-3 months postpartum       Supervision of high risk pregnancy, antepartum 05/25/2017 by Oswaldo Conroy, CNM No   Overview Addendum 10/04/2017 11:06 AM by Oswaldo Conroy, CNM    [facility] Prenatal Labs  Dating LMP unsure, dating by 9 week [place] Blood type: O/Positive/-- (11/27 1039)   Genetic Screen 1 Screen: Negative, msAFP - declined   Antibody:Negative (11/27 1039)  Anatomic [place] IComplete Rubella: 3.55 (11/27 1039) Varicella: Immune  GTT Third trimester: 125 RPR: Non Reactive (11/27 1039)   Rhogam n/a HBsAg: Negative (11/27 1039)   TDaP vaccine                        Flu Shot: declines HIV: NR  Baby Food  breast                              GBS:   Contraception  Given information Pap: 04/2017 NILM  CBB  Given information   CS/VBAC Not applicable   Support Person                  Preterm labor symptoms and general obstetric precautions including but not limited to vaginal bleeding, contractions, leaking of fluid and fetal movement were reviewed in detail with the patient. Please refer to After Visit Summary for other counseling recommendations.   - CBC today due to drop in platelets from low  100s to mid 80s over 3 week period and report of nose bleeds today. F/u with Dr Smith Robert,  prn  Return in about 2 weeks (around 11/09/2017) for Routine Prenatal Appointment.  Thomasene Mohair, MD, [name], [facility] 10/26/2017 10:23 AM

## 2017-10-27 ENCOUNTER — Telehealth: Payer: Self-pay

## 2017-10-27 LAB — CBC WITH DIFFERENTIAL/PLATELET
BASOS: 0 %
Basophils Absolute: 0 10*3/uL (ref 0.0–0.2)
EOS (ABSOLUTE): 0.2 10*3/uL (ref 0.0–0.4)
Eos: 3 %
HEMATOCRIT: 34.9 % (ref 34.0–46.6)
Hemoglobin: 11.3 g/dL (ref 11.1–15.9)
IMMATURE GRANS (ABS): 0.1 10*3/uL (ref 0.0–0.1)
Immature Granulocytes: 1 %
LYMPHS: 16 %
Lymphocytes Absolute: 1.3 10*3/uL (ref 0.7–3.1)
MCH: 31.5 pg (ref 26.6–33.0)
MCHC: 32.4 g/dL (ref 31.5–35.7)
MCV: 97 fL (ref 79–97)
Monocytes Absolute: 0.5 10*3/uL (ref 0.1–0.9)
Monocytes: 7 %
NEUTROS ABS: 5.8 10*3/uL (ref 1.4–7.0)
Neutrophils: 73 %
Platelets: 88 10*3/uL — CL (ref 150–379)
RBC: 3.59 x10E6/uL — ABNORMAL LOW (ref 3.77–5.28)
RDW: 14.8 % (ref 12.3–15.4)
WBC: 8 10*3/uL (ref 3.4–10.8)

## 2017-10-27 NOTE — Telephone Encounter (Signed)
Pt calling for results from yesterday.  [phone]

## 2017-10-27 NOTE — Telephone Encounter (Signed)
I responded to her through MyChart just now. So, I think we can mark this as complete for now, unless she calls back.

## 2017-11-09 ENCOUNTER — Ambulatory Visit (INDEPENDENT_AMBULATORY_CARE_PROVIDER_SITE_OTHER): Payer: Medicaid Other | Admitting: Certified Nurse Midwife

## 2017-11-09 ENCOUNTER — Encounter: Payer: Self-pay | Admitting: Certified Nurse Midwife

## 2017-11-09 VITALS — BP 98/58 | Wt 155.0 lb

## 2017-11-09 DIAGNOSIS — Z3A33 33 weeks gestation of pregnancy: Secondary | ICD-10-CM

## 2017-11-09 DIAGNOSIS — O99119 Other diseases of the blood and blood-forming organs and certain disorders involving the immune mechanism complicating pregnancy, unspecified trimester: Secondary | ICD-10-CM

## 2017-11-09 DIAGNOSIS — D696 Thrombocytopenia, unspecified: Secondary | ICD-10-CM

## 2017-11-09 NOTE — Progress Notes (Signed)
Pt reports no problems. Declines tdap.

## 2017-11-09 NOTE — Progress Notes (Addendum)
HROB at 33wk4d-gestational thrombocytopenia. Last platelet count  88K 2 weeks ago.  Baby active. Has received 2 iron transfusions this pregnancy Has an appointment with hematology 3 June Discussed delivery plans. Was induced with her last pregnancy at 39 weeks and platelets were available if needed.  Her plt count nadir was 55K. She did not need plt transfusion. Discussed the possibility of receiving steroids  prior to her induction to increase platelet count. Will consult hematology regarding steroids and talk to [facility]  physicians regarding timing of induction. CBC today Declines TDAP. Did sign BT consent [facility] instructions Breast feeding/ Explained BC options when breast feeding. Farrel Conners, CNM   ADDENDUM: Talked with Dr Bonney Aid and he recommended scheduling IOL at 39 weeks Farrel Conners, CNM

## 2017-11-10 LAB — CBC WITH DIFFERENTIAL/PLATELET
BASOS: 0 %
Basophils Absolute: 0 10*3/uL (ref 0.0–0.2)
EOS (ABSOLUTE): 0.2 10*3/uL (ref 0.0–0.4)
Eos: 4 %
HEMATOCRIT: 35.3 % (ref 34.0–46.6)
Hemoglobin: 11.7 g/dL (ref 11.1–15.9)
IMMATURE GRANS (ABS): 0.1 10*3/uL (ref 0.0–0.1)
IMMATURE GRANULOCYTES: 1 %
LYMPHS: 19 %
Lymphocytes Absolute: 1.2 10*3/uL (ref 0.7–3.1)
MCH: 31.8 pg (ref 26.6–33.0)
MCHC: 33.1 g/dL (ref 31.5–35.7)
MCV: 96 fL (ref 79–97)
Monocytes Absolute: 0.4 10*3/uL (ref 0.1–0.9)
Monocytes: 6 %
NEUTROS PCT: 70 %
Neutrophils Absolute: 4.6 10*3/uL (ref 1.4–7.0)
Platelets: 83 10*3/uL — CL (ref 150–379)
RBC: 3.68 x10E6/uL — ABNORMAL LOW (ref 3.77–5.28)
RDW: 14.4 % (ref 12.3–15.4)
WBC: 6.5 10*3/uL (ref 3.4–10.8)

## 2017-11-16 ENCOUNTER — Ambulatory Visit (INDEPENDENT_AMBULATORY_CARE_PROVIDER_SITE_OTHER): Payer: Medicaid Other | Admitting: Advanced Practice Midwife

## 2017-11-16 ENCOUNTER — Encounter: Payer: Self-pay | Admitting: Advanced Practice Midwife

## 2017-11-16 VITALS — BP 102/56 | Wt 159.0 lb

## 2017-11-16 DIAGNOSIS — Z3A34 34 weeks gestation of pregnancy: Secondary | ICD-10-CM

## 2017-11-16 DIAGNOSIS — D696 Thrombocytopenia, unspecified: Secondary | ICD-10-CM

## 2017-11-16 DIAGNOSIS — O99119 Other diseases of the blood and blood-forming organs and certain disorders involving the immune mechanism complicating pregnancy, unspecified trimester: Secondary | ICD-10-CM

## 2017-11-16 NOTE — Progress Notes (Signed)
Pt reports no problems.   

## 2017-11-16 NOTE — Progress Notes (Signed)
Routine Prenatal Care Visit  Subjective  Carolyn Chavez is a 27 y.o. G5P4004 at [redacted]w[redacted]d being seen today for ongoing prenatal care.  She is currently monitored for the following issues for this high-risk pregnancy and has Supervision of high risk pregnancy, antepartum; Anemia affecting pregnancy in second trimester; Iron deficiency anemia; Thrombocytopenia affecting pregnancy, antepartum (HCC); and History of ITP on their problem list.  ----------------------------------------------------------------------------------- Patient reports still feeling tired. She also has constipation and wonders what she can take. Reviewed comfort measures and safe OTC meds.   Contractions: Irregular. Vag. Bleeding: None.  Movement: Present. Denies leaking of fluid.  ----------------------------------------------------------------------------------- The following portions of the patient's history were reviewed and updated as appropriate: allergies, current medications, past family history, past medical history, past social history, past surgical history and problem list. Problem list updated.   Objective  Blood pressure (!) 102/56, weight 159 lb (72.1 kg), last menstrual period 03/19/2017, unknown if currently breastfeeding. Pregravid weight 126 lb (57.2 kg) Total Weight Gain 33 lb (15 kg) Urinalysis: Urine Protein: Negative Urine Glucose: Negative  Fetal Status: Fetal Heart Rate (bpm): 150 Fundal Height: 34 cm Movement: Present     General:  Alert, oriented and cooperative. Patient is in no acute distress.  Skin: Skin is warm and dry. No rash noted.   Cardiovascular: Normal heart rate noted  Respiratory: Normal respiratory effort, no problems with respiration noted  Abdomen: Soft, gravid, appropriate for gestational age. Pain/Pressure: Absent     Pelvic:  Cervical exam deferred        Extremities: Normal range of motion.  Edema: None  Mental Status: Normal mood and affect. Normal behavior. Normal judgment  and thought content.   Assessment   27 y.o. [id number] at [redacted]w[redacted]d by  12/24/2017, by Ultrasound presenting for routine prenatal visit  Plan   FIFTH Problems (from 05/25/17 to present)    Problem Noted Resolved   Thrombocytopenia affecting pregnancy, antepartum (HCC) 09/28/2017 by Natale Milch, MD No   Overview Signed 09/28/2017 12:32 PM by Natale Milch, MD    Suspected ITP, will need retest of plt count 1-3 months postpartum       Supervision of high risk pregnancy, antepartum 05/25/2017 by Oswaldo Conroy, CNM No   Overview Addendum 10/04/2017 11:06 AM by Oswaldo Conroy, CNM    [facility]inic [facility] Prenatal Labs  Dating LMP unsure, dating by 9 week [place] Blood type: O/Positive/-- (11/27 1039)   Genetic Screen 1 Screen: Negative, msAFP - declined   Antibody:Negative (11/27 1039)  Anatomic [place] IComplete Rubella: 3.55 (11/27 1039) Varicella: Immune  GTT Third trimester: 125 RPR: Non Reactive (11/27 1039)   Rhogam n/a HBsAg: Negative (11/27 1039)   TDaP vaccine                        Flu Shot: declines HIV: NR  Baby Food  breast                              GBS:   Contraception  Given information Pap: 04/2017 NILM  CBB  Given information   CS/VBAC Not applicable   Support Person                  Preterm labor symptoms and general obstetric precautions including but not limited to vaginal bleeding, contractions, leaking of fluid and fetal movement were reviewed in detail with the patient.   CBC at next visit  Return in about 1 week (around 11/23/2017) for rob.  Tresea Mall, CNM 11/16/2017 2:22 PM

## 2017-11-24 ENCOUNTER — Ambulatory Visit (INDEPENDENT_AMBULATORY_CARE_PROVIDER_SITE_OTHER): Payer: Medicaid Other | Admitting: Obstetrics and Gynecology

## 2017-11-24 VITALS — BP 92/58 | Wt 158.0 lb

## 2017-11-24 DIAGNOSIS — D509 Iron deficiency anemia, unspecified: Secondary | ICD-10-CM

## 2017-11-24 DIAGNOSIS — Z862 Personal history of diseases of the blood and blood-forming organs and certain disorders involving the immune mechanism: Secondary | ICD-10-CM

## 2017-11-24 DIAGNOSIS — Z3A36 36 weeks gestation of pregnancy: Secondary | ICD-10-CM

## 2017-11-24 DIAGNOSIS — D508 Other iron deficiency anemias: Secondary | ICD-10-CM

## 2017-11-24 DIAGNOSIS — E538 Deficiency of other specified B group vitamins: Secondary | ICD-10-CM

## 2017-11-24 DIAGNOSIS — O99119 Other diseases of the blood and blood-forming organs and certain disorders involving the immune mechanism complicating pregnancy, unspecified trimester: Secondary | ICD-10-CM

## 2017-11-24 DIAGNOSIS — D696 Thrombocytopenia, unspecified: Secondary | ICD-10-CM

## 2017-11-24 DIAGNOSIS — O0990 Supervision of high risk pregnancy, unspecified, unspecified trimester: Secondary | ICD-10-CM

## 2017-11-24 DIAGNOSIS — O99012 Anemia complicating pregnancy, second trimester: Secondary | ICD-10-CM

## 2017-11-24 NOTE — Progress Notes (Signed)
Routine Prenatal Care Visit  Subjective  Carolyn Chavez is a 27 y.o. G5P4004 at [redacted]w[redacted]d being seen today for ongoing prenatal care.  She is currently monitored for the following issues for this high-risk pregnancy and has Supervision of high risk pregnancy, antepartum; Anemia affecting pregnancy in second trimester; Iron deficiency anemia; Thrombocytopenia affecting pregnancy, antepartum (HCC); and History of ITP on their problem list.  ----------------------------------------------------------------------------------- Patient reports backache.   Contractions: Irregular. Vag. Bleeding: None.  Movement: Present. Denies leaking of fluid.  ----------------------------------------------------------------------------------- The following portions of the patient's history were reviewed and updated as appropriate: allergies, current medications, past family history, past medical history, past social history, past surgical history and problem list. Problem list updated.   Objective  Blood pressure (!) 92/58, weight 158 lb (71.7 kg), last menstrual period 03/19/2017, unknown if currently breastfeeding. Pregravid weight 126 lb (57.2 kg) Total Weight Gain 32 lb (14.5 kg) Urinalysis: Urine Protein: 1+ Urine Glucose: Negative  Fetal Status: Fetal Heart Rate (bpm): 143 Fundal Height: 34 cm Movement: Present  Presentation: Vertex  General:  Alert, oriented and cooperative. Patient is in no acute distress.  Skin: Skin is warm and dry. No rash noted.   Cardiovascular: Normal heart rate noted  Respiratory: Normal respiratory effort, no problems with respiration noted  Abdomen: Soft, gravid, appropriate for gestational age. Pain/Pressure: Present     Pelvic:  Cervical exam performed Dilation: Closed Effacement (%): Thick Station: -3  Extremities: Normal range of motion.     ental Status: Normal mood and affect. Normal behavior. Normal judgment and thought content.     Assessment   27 y.o. [id number]  at [redacted]w[redacted]d by  12/24/2017, by Ultrasound presenting for routine prenatal visit  Plan   FIFTH Problems (from 05/25/17 to present)    Problem Noted Resolved   Thrombocytopenia affecting pregnancy, antepartum (HCC) 09/28/2017 by Natale Milch, MD No   Overview Signed 09/28/2017 12:32 PM by Natale Milch, MD    Suspected ITP, will need retest of plt count 1-3 months postpartum       Supervision of high risk pregnancy, antepartum 05/25/2017 by Oswaldo Conroy, CNM No   Overview Addendum 10/04/2017 11:06 AM by Oswaldo Conroy, CNM    [facility] Prenatal Labs  Dating LMP unsure, dating by 9 week [place] Blood type: O/Positive/-- (11/27 1039)   Genetic Screen 1 Screen: Negative, msAFP - declined   Antibody:Negative (11/27 1039)  Anatomic [place] IComplete Rubella: 3.55 (11/27 1039) Varicella: Immune  GTT Third trimester: 125 RPR: Non Reactive (11/27 1039)   Rhogam n/a HBsAg: Negative (11/27 1039)   TDaP vaccine                        Flu Shot: declines HIV: NR  Baby Food  breast                              GBS:   Contraception  Given information Pap: 04/2017 NILM  CBB  Given information   CS/VBAC Not applicable   Support Person                  Gestational age appropriate obstetric precautions including but not limited to vaginal bleeding, contractions, leaking of fluid and fetal movement were reviewed in detail with the patient.    Weekly CBC's.  Consider delivery/ IOL if plt are less than 50. Patient does not desire a epidural.  Return in about 1 week (around 12/01/2017) for ROB.  Adelene Idler MD [facility], [facility] 11/24/17 2:52 PM

## 2017-11-24 NOTE — Progress Notes (Signed)
Pt reports no problems. GBS/aptima today.  

## 2017-11-25 ENCOUNTER — Other Ambulatory Visit: Payer: Self-pay | Admitting: *Deleted

## 2017-11-25 ENCOUNTER — Telehealth: Payer: Self-pay | Admitting: Obstetrics and Gynecology

## 2017-11-25 DIAGNOSIS — O99112 Other diseases of the blood and blood-forming organs and certain disorders involving the immune mechanism complicating pregnancy, second trimester: Secondary | ICD-10-CM

## 2017-11-25 DIAGNOSIS — D696 Thrombocytopenia, unspecified: Secondary | ICD-10-CM

## 2017-11-25 DIAGNOSIS — D649 Anemia, unspecified: Secondary | ICD-10-CM

## 2017-11-25 LAB — CBC WITH DIFFERENTIAL/PLATELET
BASOS ABS: 0 10*3/uL (ref 0.0–0.2)
Basos: 0 %
EOS (ABSOLUTE): 0.1 10*3/uL (ref 0.0–0.4)
Eos: 2 %
Hematocrit: 35.3 % (ref 34.0–46.6)
Hemoglobin: 12 g/dL (ref 11.1–15.9)
Immature Grans (Abs): 0.1 10*3/uL (ref 0.0–0.1)
Immature Granulocytes: 1 %
LYMPHS ABS: 1.3 10*3/uL (ref 0.7–3.1)
Lymphs: 17 %
MCH: 31.9 pg (ref 26.6–33.0)
MCHC: 34 g/dL (ref 31.5–35.7)
MCV: 94 fL (ref 79–97)
MONOS ABS: 0.5 10*3/uL (ref 0.1–0.9)
Monocytes: 7 %
Neutrophils Absolute: 5.7 10*3/uL (ref 1.4–7.0)
Neutrophils: 73 %
PLATELETS: 82 10*3/uL — AB (ref 150–450)
RBC: 3.76 x10E6/uL — ABNORMAL LOW (ref 3.77–5.28)
RDW: 14.4 % (ref 12.3–15.4)
WBC: 7.6 10*3/uL (ref 3.4–10.8)

## 2017-11-25 LAB — IRON AND TIBC
Iron Saturation: 27 % (ref 15–55)
Iron: 96 ug/dL (ref 27–159)
Total Iron Binding Capacity: 350 ug/dL (ref 250–450)
UIBC: 254 ug/dL (ref 131–425)

## 2017-11-25 LAB — FERRITIN: Ferritin: 73 ng/mL (ref 15–150)

## 2017-11-25 LAB — VITAMIN B12: VITAMIN B 12: 322 pg/mL (ref 232–1245)

## 2017-11-25 NOTE — Telephone Encounter (Signed)
Patient is calling for labs results. Please advise. 

## 2017-11-26 NOTE — Telephone Encounter (Signed)
Returned call and discussed results with patient

## 2017-11-28 LAB — GC/CHLAMYDIA PROBE AMP
CHLAMYDIA, DNA PROBE: NEGATIVE
Neisseria gonorrhoeae by PCR: NEGATIVE

## 2017-11-28 LAB — CULTURE, BETA STREP (GROUP B ONLY): Strep Gp B Culture: NEGATIVE

## 2017-11-28 NOTE — Progress Notes (Signed)
Negative, released to mychart

## 2017-11-29 ENCOUNTER — Inpatient Hospital Stay: Payer: Medicaid Other | Attending: Oncology | Admitting: Oncology

## 2017-11-29 ENCOUNTER — Inpatient Hospital Stay: Payer: Medicaid Other

## 2017-11-29 ENCOUNTER — Encounter: Payer: Self-pay | Admitting: Oncology

## 2017-11-29 VITALS — BP 108/63 | HR 79 | Temp 97.4°F | Resp 18 | Ht 66.0 in | Wt 159.3 lb

## 2017-11-29 DIAGNOSIS — Z87891 Personal history of nicotine dependence: Secondary | ICD-10-CM | POA: Diagnosis not present

## 2017-11-29 DIAGNOSIS — Z331 Pregnant state, incidental: Secondary | ICD-10-CM | POA: Insufficient documentation

## 2017-11-29 DIAGNOSIS — D696 Thrombocytopenia, unspecified: Secondary | ICD-10-CM | POA: Insufficient documentation

## 2017-11-29 DIAGNOSIS — E538 Deficiency of other specified B group vitamins: Secondary | ICD-10-CM | POA: Insufficient documentation

## 2017-11-29 DIAGNOSIS — D649 Anemia, unspecified: Secondary | ICD-10-CM

## 2017-11-29 DIAGNOSIS — Z79899 Other long term (current) drug therapy: Secondary | ICD-10-CM | POA: Insufficient documentation

## 2017-11-29 DIAGNOSIS — D509 Iron deficiency anemia, unspecified: Secondary | ICD-10-CM | POA: Insufficient documentation

## 2017-11-29 DIAGNOSIS — O99112 Other diseases of the blood and blood-forming organs and certain disorders involving the immune mechanism complicating pregnancy, second trimester: Secondary | ICD-10-CM

## 2017-11-29 DIAGNOSIS — O99119 Other diseases of the blood and blood-forming organs and certain disorders involving the immune mechanism complicating pregnancy, unspecified trimester: Secondary | ICD-10-CM

## 2017-11-29 LAB — IRON AND TIBC
Iron: 90 ug/dL (ref 28–170)
Saturation Ratios: 23 % (ref 10.4–31.8)
TIBC: 394 ug/dL (ref 250–450)
UIBC: 304 ug/dL

## 2017-11-29 LAB — CBC WITH DIFFERENTIAL/PLATELET
BASOS PCT: 0 %
Basophils Absolute: 0 10*3/uL (ref 0–0.1)
EOS ABS: 0.1 10*3/uL (ref 0–0.7)
Eosinophils Relative: 2 %
HEMATOCRIT: 35.2 % (ref 35.0–47.0)
Hemoglobin: 12.1 g/dL (ref 12.0–16.0)
LYMPHS ABS: 1.2 10*3/uL (ref 1.0–3.6)
Lymphocytes Relative: 20 %
MCH: 32.4 pg (ref 26.0–34.0)
MCHC: 34.2 g/dL (ref 32.0–36.0)
MCV: 94.8 fL (ref 80.0–100.0)
MONO ABS: 0.4 10*3/uL (ref 0.2–0.9)
MONOS PCT: 6 %
NEUTROS ABS: 4.4 10*3/uL (ref 1.4–6.5)
Neutrophils Relative %: 72 %
Platelets: 71 10*3/uL — ABNORMAL LOW (ref 150–440)
RBC: 3.71 MIL/uL — ABNORMAL LOW (ref 3.80–5.20)
RDW: 13.6 % (ref 11.5–14.5)
WBC: 6.1 10*3/uL (ref 3.6–11.0)

## 2017-11-29 LAB — FERRITIN: FERRITIN: 35 ng/mL (ref 11–307)

## 2017-11-29 NOTE — Progress Notes (Signed)
Hematology/Oncology Consult note [facility]  Telephone:([phone] [phone]  Patient Care Team: System, Pcp Not In as PCP - General   Name of the patient: Carolyn Chavez  [id number]  July 10, 1990   Date of visit: 11/29/17  Diagnosis- 1. 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 Iron and b12 deficiency anemia   Chief complaint/ Reason for visit- routine f/u of anemia and thrombocytopenia  Heme/Onc history: patient is a 27 year old African-American female G5P5 L4 currently [redacted] weeks pregnant with her fifth pregnancy. She has been referred to [place] for thrombocytopenia in pregnancy. All her 4 prior pregnancies have been normal rate and agrees without any bleeding complications. She was however noted to have thrombocytopenia during her last 2 pregnancies in 2013 and2016. Her platelet counts during last 2 pregnancies were between 60-70s. She was induced at 37 weeks during her fourth pregnancy as her platelet count dropped to 60. She has not required any platelet or blood transfusions during her pregnancy and her thrombocytopenia has also not required any treatment so far during her pregnancies. She does not remember if her platelet count was checked outside of her pregnancy. She currently reports occasional nosebleeds but denies other complaints she has not had any vaginal bleeding. She has not required epidural anesthesia for her 4 prior pregnancies.  Currently patient reports mild fatigue but denies other complaints. Her hemoglobin in February 2019 was 10.1. She is currently taking prenatal vitamins. Denies any over-the-counter medications or herbal supplements.   Interval history- her pregnancy is coming along well so far. Denies any bleeding/ bruising. Tolerated venofer well. She is on oral B12. Reports that she may be going for AROm and possible delivery next week  ECOG PS- 0 Pain scale- 0   Review of systems- Review of Systems    Constitutional: Negative for chills, fever, malaise/fatigue and weight loss.  HENT: Negative for congestion, ear discharge and nosebleeds.   Eyes: Negative for blurred vision.  Respiratory: Negative for cough, hemoptysis, sputum production, shortness of breath and wheezing.   Cardiovascular: Negative for chest pain, palpitations, orthopnea and claudication.  Gastrointestinal: Negative for abdominal pain, blood in stool, constipation, diarrhea, heartburn, melena, nausea and vomiting.  Genitourinary: Negative for dysuria, flank pain, frequency, hematuria and urgency.  Musculoskeletal: Negative for back pain, joint pain and myalgias.  Skin: Negative for rash.  Neurological: Negative for dizziness, tingling, focal weakness, seizures, weakness and headaches.  Endo/Heme/Allergies: Does not bruise/bleed easily.  Psychiatric/Behavioral: Negative for depression and suicidal ideas. The patient does not have insomnia.       No Known Allergies   Past Medical History:  Diagnosis Date  . Abnormal Pap smear of cervix    age 27  . History of Papanicolaou smear of cervix [id number]; 04/01/2011   neg; neg, ct/gc neg;   . Pica    craves flour  . Thrombocytopenia (HCC)   . UTI (urinary tract infection)      Past Surgical History:  Procedure Laterality Date  . COLPOSCOPY      Social History   Socioeconomic History  . Marital status: Married    Spouse name: Not on file  . Number of children: 4  . Years of education: 80  . Highest education level: Not on file  Occupational History  . Not on file  Social Needs  . Financial resource strain: Not on file  . Food insecurity:    Worry: Not on file    Inability: Not on file  . Transportation needs:  Medical: Not on file    Non-medical: Not on file  Tobacco Use  . Smoking status: Former Smoker    Last attempt to quit: 03/22/2014    Years since quitting: 3.6  . Smokeless tobacco: Never Used  Substance and Sexual Activity  . Alcohol use:  Yes  . Drug use: No  . Sexual activity: Yes    Birth control/protection: Other-see comments  Lifestyle  . Physical activity:    Days per week: Not on file    Minutes per session: Not on file  . Stress: Not on file  Relationships  . Social connections:    Talks on phone: Not on file    Gets together: Not on file    Attends religious service: Not on file    Active member of club or organization: Not on file    Attends meetings of clubs or organizations: Not on file    Relationship status: Not on file  . Intimate partner violence:    Fear of current or ex partner: Not on file    Emotionally abused: Not on file    Physically abused: Not on file    Forced sexual activity: Not on file  Other Topics Concern  . Not on file  Social History Narrative  . Not on file    Family History  Problem Relation Age of Onset  . Cancer Maternal Grandfather 60       lung, brain  . Hypertension Maternal Grandfather   . Skin cancer Maternal Grandfather   . Schizophrenia Brother   . Hearing loss Daughter        2/16; oldest daughter; low freq loss     Current Outpatient Medications:  .  Prenatal Vit-Fe Fumarate-FA (PRENATAL MULTIVITAMIN) TABS tablet, Take 1 tablet by mouth daily at 12 noon., Disp: , Rfl:  .  vitamin B-12 (CYANOCOBALAMIN) 1000 MCG tablet, Take 1,000 mcg by mouth daily., Disp: , Rfl:   Physical exam:  Vitals:   11/29/17 1022  BP: 108/63  Pulse: 79  Resp: 18  Temp: (!) 97.4 F (36.3 C)  SpO2: 100%  Weight: 159 lb 4.5 oz (72.3 kg)  Height: 5\' 6"  (1.676 m)   Physical Exam  Constitutional: She is oriented to person, place, and time.  HENT:  Head: Normocephalic and atraumatic.  Eyes: Pupils are equal, round, and reactive to light. EOM are normal.  Neck: Normal range of motion.  Cardiovascular: Normal rate, regular rhythm and normal heart sounds.  Pulmonary/Chest: Effort normal and breath sounds normal.  Abdominal:  Gravid uterus  Neurological: She is alert and  oriented to person, place, and time.  Skin: Skin is warm and dry.     CMP Latest Ref Rng & Units 09/20/2017  Glucose 65 - 99 mg/dL 86  BUN 6 - 20 mg/dL 5(L)  Creatinine 1.610.44 - 1.00 mg/dL 0.960.48  Sodium 045135 - 409145 mmol/L 133(L)  Potassium 3.5 - 5.1 mmol/L 3.6  Chloride 101 - 111 mmol/L 102  CO2 22 - 32 mmol/L 24  Calcium 8.9 - 10.3 mg/dL 8.2(L)  Total Protein 6.5 - 8.1 g/dL 7.0  Total Bilirubin 0.3 - 1.2 mg/dL 0.7  Alkaline Phos 38 - 126 U/L 51  AST 15 - 41 U/L 16  ALT 14 - 54 U/L 8(L)   CBC Latest Ref Rng & Units 11/29/2017  WBC 3.6 - 11.0 K/uL 6.1  Hemoglobin 12.0 - 16.0 g/dL 81.112.1  Hematocrit 91.435.0 - 47.0 % 35.2  Platelets 150 - 440 K/uL 71(L)  Assessment and plan- Patient is a 27 y.o. female currently [redacted] weeks pregnant 5th pregnancy with following issues:  1. Thrombocytopenia- due to ITP during pregnancy. Platelet counts slowly trending down but still >50. Continue watchful monitoring. Will consider steroids if they are <50. Repeat counts next week. She is planning to go through normal delivery. No elective C section planned. Also she is not planning to take neuraxial anethesia. I will discuss further with her OB.  2. Iron and b12 deficiency- she is on oral b12 with improvement in her levels. She received venofer and repeat iron studies last month were significantly improved.  I will see her back 2 months from now with repeat cbc with diff. Labs only- cbc next week   Visit Diagnosis 1. Iron deficiency anemia, unspecified iron deficiency anemia type   2. Thrombocytopenia affecting pregnancy (HCC)      Dr. Owens Shark, MD, MPH [facility] [phone] 11/29/2017 12:42 PM

## 2017-11-29 NOTE — Progress Notes (Signed)
No new changes noted today 

## 2017-12-01 ENCOUNTER — Ambulatory Visit (INDEPENDENT_AMBULATORY_CARE_PROVIDER_SITE_OTHER): Payer: Medicaid Other | Admitting: Obstetrics & Gynecology

## 2017-12-01 VITALS — BP 100/60 | Wt 158.0 lb

## 2017-12-01 DIAGNOSIS — Z3A36 36 weeks gestation of pregnancy: Secondary | ICD-10-CM

## 2017-12-01 DIAGNOSIS — D696 Thrombocytopenia, unspecified: Secondary | ICD-10-CM

## 2017-12-01 DIAGNOSIS — O99119 Other diseases of the blood and blood-forming organs and certain disorders involving the immune mechanism complicating pregnancy, unspecified trimester: Secondary | ICD-10-CM

## 2017-12-01 DIAGNOSIS — O0990 Supervision of high risk pregnancy, unspecified, unspecified trimester: Secondary | ICD-10-CM

## 2017-12-01 NOTE — Progress Notes (Signed)
  Subjective  Fetal Movement? yes Contractions? no Leaking Fluid? no Vaginal Bleeding? no  Objective  BP 100/60   Wt 158 lb (71.7 kg)   LMP 03/19/2017 (Approximate)   BMI 25.50 kg/m  General: NAD Pumonary: no increased work of breathing Abdomen: gravid, non-tender Extremities: no edema Psychiatric: mood appropriate, affect full  Assessment  27 y.o. [id number] at 843w5d by  12/24/2017, by Ultrasound presenting for routine prenatal visit  Plan   Problem List Items Addressed This Visit      Hematopoietic and Hemostatic   Thrombocytopenia affecting pregnancy, antepartum (HCC)     Other   Supervision of high risk pregnancy, antepartum    Other Visit Diagnoses    [redacted] weeks gestation of pregnancy    -  Primary    Labor precautions Platelets discussed (71,000 on 11/29/17) OK for Membrane stripping at 38 weeks and IOL 39 weeks  Annamarie MajorPaul Silvie Obremski, MD, Merlinda FrederickFACOG [facility], [facility] 12/01/2017  11:46 AM

## 2017-12-03 ENCOUNTER — Telehealth: Payer: Self-pay | Admitting: Obstetrics and Gynecology

## 2017-12-03 NOTE — Telephone Encounter (Signed)
Please advise 

## 2017-12-03 NOTE — Telephone Encounter (Signed)
We do not recommend she undertake any measures to make her go into labor prior to 39 weeks

## 2017-12-03 NOTE — Telephone Encounter (Signed)
Patient is calling needing some advice on how to increase her chance for Dilations. Patient is 37 weeks and spoke to her last provider about stripping her membrane. Please advise

## 2017-12-06 ENCOUNTER — Ambulatory Visit (INDEPENDENT_AMBULATORY_CARE_PROVIDER_SITE_OTHER): Payer: Medicaid Other | Admitting: Obstetrics & Gynecology

## 2017-12-06 ENCOUNTER — Inpatient Hospital Stay: Payer: Medicaid Other

## 2017-12-06 VITALS — BP 100/60 | Wt 160.0 lb

## 2017-12-06 DIAGNOSIS — Z0371 Encounter for suspected problem with amniotic cavity and membrane ruled out: Secondary | ICD-10-CM | POA: Diagnosis not present

## 2017-12-06 DIAGNOSIS — D696 Thrombocytopenia, unspecified: Secondary | ICD-10-CM | POA: Diagnosis not present

## 2017-12-06 DIAGNOSIS — D509 Iron deficiency anemia, unspecified: Secondary | ICD-10-CM

## 2017-12-06 LAB — CBC WITH DIFFERENTIAL/PLATELET
BASOS ABS: 0 10*3/uL (ref 0–0.1)
Basophils Relative: 0 %
EOS PCT: 1 %
Eosinophils Absolute: 0.1 10*3/uL (ref 0–0.7)
HCT: 36.4 % (ref 35.0–47.0)
HEMOGLOBIN: 12.3 g/dL (ref 12.0–16.0)
LYMPHS PCT: 16 %
Lymphs Abs: 0.9 10*3/uL — ABNORMAL LOW (ref 1.0–3.6)
MCH: 32.1 pg (ref 26.0–34.0)
MCHC: 34 g/dL (ref 32.0–36.0)
MCV: 94.5 fL (ref 80.0–100.0)
Monocytes Absolute: 0.3 10*3/uL (ref 0.2–0.9)
Monocytes Relative: 5 %
NEUTROS PCT: 78 %
Neutro Abs: 4.3 10*3/uL (ref 1.4–6.5)
PLATELETS: 67 10*3/uL — AB (ref 150–440)
RBC: 3.85 MIL/uL (ref 3.80–5.20)
RDW: 13.7 % (ref 11.5–14.5)
WBC: 5.5 10*3/uL (ref 3.6–11.0)

## 2017-12-06 NOTE — Telephone Encounter (Signed)
Called pt and she is aware. Pt stated "I would like to come in just to get checked if Im dilated because I don't want to come in to get my membranes stripped if Im not dilated" Pt advised to wait until next appt which is Friday 12/10/17

## 2017-12-06 NOTE — Progress Notes (Signed)
  Subjective  Fetal Movement? yes Contractions? no Leaking Fluid? yes Vaginal Bleeding? no Leakage since 1400 yesterday, pressure but no ctxs Objective  BP 100/60   Wt 160 lb (72.6 kg)   LMP 03/19/2017 (Approximate)   BMI 25.82 kg/m  General: NAD Pumonary: no increased work of breathing Abdomen: gravid, non-tender Extremities: no edema Psychiatric: mood appropriate, affect full  Assessment  27 y.o. [id number] at 5235w3d by  12/24/2017, by Ultrasound presenting for routine prenatal visit  Plan   Problem List Items Addressed This Visit    None    Visit Diagnoses    No leakage of amniotic fluid into vagina    -  Primary    SSE- neg nitrazine and pooling;  Also NEG FERN TEST SVE- 1/40/-3, Vtx Plan appt later this week; OK to IOL due to thrombocytopenia (down to 67,000 today per hematology) after 38 weeks (this weekend)       This has been tentatively sch for 6/15 at 0500  Annamarie MajorPaul Jacen Carlini, MD, Merlinda FrederickFACOG [facility], [facility] 12/06/2017  2:27 PM

## 2017-12-09 ENCOUNTER — Other Ambulatory Visit: Payer: Self-pay | Admitting: *Deleted

## 2017-12-09 DIAGNOSIS — O99119 Other diseases of the blood and blood-forming organs and certain disorders involving the immune mechanism complicating pregnancy, unspecified trimester: Principal | ICD-10-CM

## 2017-12-09 DIAGNOSIS — D696 Thrombocytopenia, unspecified: Secondary | ICD-10-CM

## 2017-12-10 ENCOUNTER — Ambulatory Visit (INDEPENDENT_AMBULATORY_CARE_PROVIDER_SITE_OTHER): Payer: Medicaid Other | Admitting: Obstetrics & Gynecology

## 2017-12-10 ENCOUNTER — Encounter: Payer: Medicaid Other | Admitting: Obstetrics and Gynecology

## 2017-12-10 VITALS — BP 104/58 | Wt 158.0 lb

## 2017-12-10 DIAGNOSIS — O0990 Supervision of high risk pregnancy, unspecified, unspecified trimester: Secondary | ICD-10-CM

## 2017-12-10 DIAGNOSIS — O99119 Other diseases of the blood and blood-forming organs and certain disorders involving the immune mechanism complicating pregnancy, unspecified trimester: Secondary | ICD-10-CM

## 2017-12-10 DIAGNOSIS — D696 Thrombocytopenia, unspecified: Secondary | ICD-10-CM

## 2017-12-10 DIAGNOSIS — Z3A38 38 weeks gestation of pregnancy: Secondary | ICD-10-CM

## 2017-12-10 NOTE — Progress Notes (Signed)
History and Physical  Carolyn Chavez is a 27 y.o. [id number] 8317w0d  for Induction of Labor scheduled due to Thrombocytopenia affecting pregnancy now 38 weeks with recomendations for delivery before platelet count drops too low .   See labor record for pregnancy highlights.  No recent pain, bleeding, ruptured membranes, or other signs of progressing labor.  PMHx: She  has a past medical history of Abnormal Pap smear of cervix, History of Papanicolaou smear of cervix ([id number]; 04/01/2011), Pica, Thrombocytopenia (HCC), and UTI (urinary tract infection). Also,  has a past surgical history that includes Colposcopy., family history includes Cancer (age of onset: 29) in her maternal grandfather; Hearing loss in her daughter; Hypertension in her maternal grandfather; Schizophrenia in her brother; Skin cancer in her maternal grandfather.,  reports that she quit smoking about 3 years ago. She has never used smokeless tobacco. She reports that she drinks alcohol. She reports that she does not use drugs. She has a current medication list which includes the following prescription(s): prenatal multivitamin and vitamin b-12. Also, has No Known Allergies. OB History  Gravida Para Term Preterm AB Living  5 4 4     4   SAB TAB Ectopic Multiple Live Births        0 4    # Outcome Date GA Lbr Len/2nd Weight Sex Delivery Anes PTL Lv  5 Current           4 Term 12/21/14 1753w1d 02:18 8 lb 6.4 oz (3.81 kg) M Vag-Spont None  LIV     Birth Comments: THROMBOCYTOPENIA  3 Term 08/20/11 3843w0d  8 lb (3.629 kg) M Vag-Spont  N LIV     Birth Comments: PRECIPITOUS  2 Term 03/05/10 2643w0d  7 lb 15 oz (3.6 kg) F Vag-Spont None N LIV     Birth Comments: PRECIPITOUS  1 Term 10/03/07 3643w0d  6 lb 15 oz (3.147 kg) F Vag-Spont None N LIV  Patient denies any other pertinent gynecologic issues.   Review of Systems  Constitutional: Negative for chills, fever and malaise/fatigue.  HENT: Negative for congestion, sinus pain and sore  throat.   Eyes: Negative for blurred vision and pain.  Respiratory: Negative for cough and wheezing.   Cardiovascular: Negative for chest pain and leg swelling.  Gastrointestinal: Negative for abdominal pain, constipation, diarrhea, heartburn, nausea and vomiting.  Genitourinary: Negative for dysuria, frequency, hematuria and urgency.  Musculoskeletal: Negative for back pain, joint pain, myalgias and neck pain.  Skin: Negative for itching and rash.  Neurological: Negative for dizziness, tremors and weakness.  Endo/Heme/Allergies: Does not bruise/bleed easily.  Psychiatric/Behavioral: Negative for depression. The patient is not nervous/anxious and does not have insomnia.     Objective: BP (!) 104/58   Wt 158 lb (71.7 kg)   LMP 03/19/2017 (Approximate)   BMI 25.50 kg/m  Physical Exam  Constitutional: She is oriented to person, place, and time. She appears well-developed and well-nourished. No distress.  Genitourinary: Rectum normal, vagina normal and uterus normal. Pelvic exam was performed with patient supine. There is no rash or lesion on the right labia. There is no rash or lesion on the left labia. Vagina exhibits no lesion. No bleeding in the vagina. Right adnexum does not display mass and does not display tenderness. Left adnexum does not display mass and does not display tenderness. Cervix does not exhibit motion tenderness, lesion, friability or polyp.   Uterus is mobile and midaxial. Uterus is not enlarged or exhibiting a mass.  Genitourinary Comments: Cx 1/40/-3  HENT:  Head: Normocephalic and atraumatic. Head is without laceration.  Right Ear: Hearing normal.  Left Ear: Hearing normal.  Nose: No epistaxis.  No foreign bodies.  Mouth/Throat: Uvula is midline, oropharynx is clear and moist and mucous membranes are normal.  Eyes: Pupils are equal, round, and reactive to light.  Neck: Normal range of motion. Neck supple. No thyromegaly present.  Cardiovascular: Normal rate and  regular rhythm. Exam reveals no gallop and no friction rub.  No murmur heard. Pulmonary/Chest: Effort normal and breath sounds normal. No respiratory distress. She has no wheezes. Right breast exhibits no mass, no skin change and no tenderness. Left breast exhibits no mass, no skin change and no tenderness.  Abdominal: Bowel sounds are normal. She exhibits no distension. There is no tenderness. There is no rebound.  Gravid, Vtx, NT  Musculoskeletal: Normal range of motion.  Neurological: She is alert and oriented to person, place, and time. No cranial nerve deficit.  Skin: Skin is warm and dry.  Psychiatric: She has a normal mood and affect. Judgment normal.  Vitals reviewed.   Assessment: Term Pregnancy for Induction of Labor due to Thrombocytopenia.  Plan: Patient will undergo induction of labor with cervical ripening agents.     Patient has been fully informed of the pros and cons, risks and benefits of continued observation with fetal monitoring versus that of induction of labor.   She understands that there are uncommon risks to induction, which include but are not limited to : frequent or prolonged uterine contractions, fetal distress, uterine rupture, and lack of successful induction.  These risks include all methods including Pitocin and Misoprostol and Cervadil.  Patient understands that using Misoprostol for labor induction is an "off label" indication although it has been studied extensively for this purpose and is an accepted method of induction.  She also has been informed of the increased risks for Cesarean with induction and should induction not be successful.  Patient consents to the induction plan of management.  Plans bilateral tubal ligation, and this to be done at 6 weeks post partum for contraception TDaP declined this pregnancy  Annamarie Major, MD, [name], [facility] 12/10/2017  2:29 PM

## 2017-12-11 ENCOUNTER — Encounter: Payer: Self-pay | Admitting: Anesthesiology

## 2017-12-11 ENCOUNTER — Inpatient Hospital Stay
Admission: EM | Admit: 2017-12-11 | Discharge: 2017-12-13 | DRG: 807 | Disposition: A | Discharge status: Home / Self Care | Payer: Medicaid Other | Attending: Obstetrics & Gynecology | Admitting: Obstetrics & Gynecology

## 2017-12-11 ENCOUNTER — Other Ambulatory Visit: Payer: Self-pay

## 2017-12-11 DIAGNOSIS — Z862 Personal history of diseases of the blood and blood-forming organs and certain disorders involving the immune mechanism: Secondary | ICD-10-CM

## 2017-12-11 DIAGNOSIS — O9912 Other diseases of the blood and blood-forming organs and certain disorders involving the immune mechanism complicating childbirth: Principal | ICD-10-CM | POA: Diagnosis present

## 2017-12-11 DIAGNOSIS — Z3A38 38 weeks gestation of pregnancy: Secondary | ICD-10-CM

## 2017-12-11 DIAGNOSIS — O99119 Other diseases of the blood and blood-forming organs and certain disorders involving the immune mechanism complicating pregnancy, unspecified trimester: Secondary | ICD-10-CM

## 2017-12-11 DIAGNOSIS — D6959 Other secondary thrombocytopenia: Secondary | ICD-10-CM | POA: Diagnosis present

## 2017-12-11 DIAGNOSIS — Z3490 Encounter for supervision of normal pregnancy, unspecified, unspecified trimester: Secondary | ICD-10-CM

## 2017-12-11 DIAGNOSIS — D696 Thrombocytopenia, unspecified: Secondary | ICD-10-CM | POA: Diagnosis present

## 2017-12-11 DIAGNOSIS — O0990 Supervision of high risk pregnancy, unspecified, unspecified trimester: Secondary | ICD-10-CM

## 2017-12-11 LAB — CBC
HEMATOCRIT: 36.3 % (ref 35.0–47.0)
HEMOGLOBIN: 12.8 g/dL (ref 12.0–16.0)
MCH: 32.9 pg (ref 26.0–34.0)
MCHC: 35.2 g/dL (ref 32.0–36.0)
MCV: 93.5 fL (ref 80.0–100.0)
PLATELETS: 78 10*3/uL — AB (ref 150–440)
RBC: 3.88 MIL/uL (ref 3.80–5.20)
RDW: 13.7 % (ref 11.5–14.5)
WBC: 6.4 10*3/uL (ref 3.6–11.0)

## 2017-12-11 LAB — TYPE AND SCREEN
ABO/RH(D): O POS
Antibody Screen: NEGATIVE

## 2017-12-11 MED ORDER — TETANUS-DIPHTH-ACELL PERTUSSIS 5-2.5-18.5 LF-MCG/0.5 IM SUSP: 0.5000 mL | Freq: Once | INTRAMUSCULAR | Status: DC

## 2017-12-11 MED ORDER — IBUPROFEN 600 MG PO TABS
ORAL_TABLET | ORAL | Status: AC
  Administered 2017-12-11: 600 mg via ORAL
  Filled 2017-12-11: qty 1

## 2017-12-11 MED ORDER — DIBUCAINE 1 % RE OINT: 1.0000 "application " | TOPICAL_OINTMENT | RECTAL | Status: DC | PRN

## 2017-12-11 MED ORDER — DIPHENHYDRAMINE HCL 25 MG PO CAPS: 25.0000 mg | ORAL_CAPSULE | Freq: Four times a day (QID) | ORAL | Status: DC | PRN

## 2017-12-11 MED ORDER — AMMONIA AROMATIC IN INHA
RESPIRATORY_TRACT | Status: AC
  Filled 2017-12-11: qty 10

## 2017-12-11 MED ORDER — IBUPROFEN 600 MG PO TABS
600.0000 mg | ORAL_TABLET | Freq: Four times a day (QID) | ORAL | Status: DC
  Administered 2017-12-11 – 2017-12-13 (×6): 600 mg via ORAL
  Filled 2017-12-11 (×6): qty 1

## 2017-12-11 MED ORDER — LACTATED RINGERS IV SOLN: 500.0000 mL | INTRAVENOUS | Status: DC | PRN

## 2017-12-11 MED ORDER — BUTORPHANOL TARTRATE 1 MG/ML IJ SOLN: 1.0000 mg | INTRAMUSCULAR | Status: DC | PRN

## 2017-12-11 MED ORDER — ACETAMINOPHEN 325 MG PO TABS
650.0000 mg | ORAL_TABLET | ORAL | Status: DC | PRN
  Administered 2017-12-12 (×2): 650 mg via ORAL
  Filled 2017-12-11 (×2): qty 2

## 2017-12-11 MED ORDER — OXYTOCIN 10 UNIT/ML IJ SOLN
INTRAMUSCULAR | Status: AC
  Filled 2017-12-11: qty 2

## 2017-12-11 MED ORDER — ACETAMINOPHEN 325 MG PO TABS: 650.0000 mg | ORAL_TABLET | ORAL | Status: DC | PRN

## 2017-12-11 MED ORDER — ONDANSETRON HCL 4 MG/2ML IJ SOLN: 4.0000 mg | INTRAMUSCULAR | Status: DC | PRN

## 2017-12-11 MED ORDER — OXYTOCIN BOLUS FROM INFUSION
500.0000 mL | Freq: Once | INTRAVENOUS | Status: AC
  Administered 2017-12-11: 500 mL via INTRAVENOUS

## 2017-12-11 MED ORDER — COCONUT OIL OIL
1.0000 "application " | TOPICAL_OIL | Status: DC | PRN
  Administered 2017-12-12: 1 via TOPICAL
  Filled 2017-12-11: qty 120

## 2017-12-11 MED ORDER — SIMETHICONE 80 MG PO CHEW: 80.0000 mg | CHEWABLE_TABLET | ORAL | Status: DC | PRN

## 2017-12-11 MED ORDER — BENZOCAINE-MENTHOL 20-0.5 % EX AERO: 1.0000 "application " | INHALATION_SPRAY | CUTANEOUS | Status: DC | PRN

## 2017-12-11 MED ORDER — LACTATED RINGERS IV SOLN
INTRAVENOUS | Status: DC
  Administered 2017-12-11: 06:00:00 via INTRAVENOUS

## 2017-12-11 MED ORDER — LIDOCAINE HCL (PF) 1 % IJ SOLN
INTRAMUSCULAR | Status: AC
  Filled 2017-12-11: qty 30

## 2017-12-11 MED ORDER — TERBUTALINE SULFATE 1 MG/ML IJ SOLN: 0.2500 mg | Freq: Once | INTRAMUSCULAR | Status: DC | PRN

## 2017-12-11 MED ORDER — OXYTOCIN 40 UNITS IN LACTATED RINGERS INFUSION - SIMPLE MED
2.5000 [IU]/h | INTRAVENOUS | Status: DC
  Filled 2017-12-11: qty 1000

## 2017-12-11 MED ORDER — WITCH HAZEL-GLYCERIN EX PADS: 1.0000 "application " | MEDICATED_PAD | CUTANEOUS | Status: DC | PRN

## 2017-12-11 MED ORDER — ONDANSETRON HCL 4 MG/2ML IJ SOLN: 4.0000 mg | Freq: Four times a day (QID) | INTRAMUSCULAR | Status: DC | PRN

## 2017-12-11 MED ORDER — ONDANSETRON HCL 4 MG PO TABS: 4.0000 mg | ORAL_TABLET | ORAL | Status: DC | PRN

## 2017-12-11 MED ORDER — SENNOSIDES-DOCUSATE SODIUM 8.6-50 MG PO TABS
2.0000 | ORAL_TABLET | ORAL | Status: DC
  Administered 2017-12-12 – 2017-12-13 (×2): 2 via ORAL
  Filled 2017-12-11 (×2): qty 2

## 2017-12-11 MED ORDER — MISOPROSTOL 25 MCG QUARTER TABLET
25.0000 ug | ORAL_TABLET | ORAL | Status: DC | PRN
  Administered 2017-12-11 (×2): 25 ug via VAGINAL
  Filled 2017-12-11 (×2): qty 1

## 2017-12-11 MED ORDER — MISOPROSTOL 200 MCG PO TABS
ORAL_TABLET | ORAL | Status: AC
  Administered 2017-12-11: 25 ug via VAGINAL
  Filled 2017-12-11: qty 4

## 2017-12-11 MED ORDER — PRENATAL MULTIVITAMIN CH
1.0000 | ORAL_TABLET | Freq: Every day | ORAL | Status: DC
  Administered 2017-12-12: 1 via ORAL
  Filled 2017-12-11: qty 1

## 2017-12-11 NOTE — OB Triage Note (Signed)
Patient came in at 0515 today for scheduled Induction.

## 2017-12-11 NOTE — Discharge Summary (Signed)
OB Discharge Summary     Patient Name: Carolyn Chavez DOB: 1990-11-11 MRN: [id number]  Date of admission: 12/11/2017 Delivering MD: Tresea MallJane Gledhill, CNM  Date of Delivery: 12/11/2017  Date of discharge: 12/13/2017  Admitting diagnosis: 38 wks preg induction Intrauterine pregnancy: 6981w1d     Secondary diagnosis: gestational thrombocytopenia     Discharge diagnosis: Term Pregnancy Delivered                                                                                                Post partum procedures: none  Augmentation: AROM and Cytotec  Complications: None  Hospital course:  Induction of Labor With Vaginal Delivery   27 y.o. yo [id number] at 3881w1d was admitted to the hospital 12/11/2017 for induction of labor.   Indication for induction: Gestational Thrombocytopenia.   Patient had an uncomplicated labor course as follows: Membrane Rupture Time/Date: 4:25 PM ,12/11/2017   Patient had delivery of viable female 5:20 PM, 12/11/2017 Details of delivery can be found in a separate delivery note  Patient had a routine postpartum course.  She is ambulating and voiding without difficulty.  She is tolerating PO intake and her pain is well controlled with PO PRN medications.  Patient is discharged home 12/13/2017. Discharge platelet count is 74,000.  Physical exam  Vitals:   12/12/17 1623 12/12/17 2359 12/13/17 0811 12/13/17 0813  BP: 103/64 (!) 99/59  113/69  Pulse: (!) 58 62 64 62  Resp: 18 16 18    Temp: 98 F (36.7 C) (!) 97.5 F (36.4 C) 98 F (36.7 C)   TempSrc: Oral Oral Oral   SpO2: 98% 97% 96%   Weight:      Height:       General: alert, cooperative and no distress Lochia: appropriate Uterine Fundus: firm/ U-2/ML/NT Incision: N/A DVT Evaluation: No evidence of DVT seen on physical exam.  Labs: Lab Results  Component Value Date   WBC 8.2 12/12/2017   HGB 12.5 12/12/2017   HCT 35.1 12/12/2017   MCV 94.4 12/12/2017   PLT 74 (L) 12/12/2017    Discharge  instruction: per After Visit Summary.  Medications:  Allergies as of 12/13/2017   No Known Allergies     Medication List    TAKE these medications   acetaminophen 325 MG tablet Commonly known as:  TYLENOL Take 2 tablets (650 mg total) by mouth every 6 (six) hours as needed for mild pain or moderate pain.   ibuprofen 600 MG tablet Commonly known as:  ADVIL,MOTRIN Take 1 tablet (600 mg total) by mouth every 6 (six) hours as needed for mild pain, moderate pain or cramping.   prenatal multivitamin Tabs tablet Take 1 tablet by mouth daily at 12 noon.   vitamin B-12 1000 MCG tablet Commonly known as:  CYANOCOBALAMIN Take 1,000 mcg by mouth daily.       Diet: routine diet  Activity: Advance as tolerated. Pelvic rest for 6 weeks.   Outpatient follow up: Follow-up Information    Nadara MustardHarris, Robert P, MD. Schedule an appointment as soon as possible for a visit in 4 week(s).   Specialty:  Obstetrics and Gynecology Why:  For post partum AND tubal planning Contact information: [address] [place] [address] [phone]             Postpartum contraception: interval tubal allowing platelet recovery time Rhogam Given postpartum: NA Rubella vaccine given postpartum: Rubella Immune Varicella vaccine given postpartum: Varicella Immune TDaP given antepartum or postpartum: declined  Newborn Data: Live born female Londyn Birth Weight:  2948 g (6#8oz) APGAR: 8, 9  Newborn Delivery   Birth date/time:  12/11/2017 17:20:00 Delivery type:  Vaginal, Spontaneous      Baby Feeding: Breast  Disposition: home with mother  SIGNED:  Farrel Conners, CNM 12/13/2017 10:22 AM

## 2017-12-11 NOTE — Progress Notes (Signed)
  Labor Progress Note   27 y.o. Z6X0960G5P4004 @ 4559w1d , admitted for  Pregnancy, Labor Management. IOL for gestational thrombocytopenia  Subjective:  Contractions are feeling stronger and she is feeling some pressure. Desires cervical check.  Objective:  BP 114/70 (BP Location: Right Arm)   Pulse 74   Temp 98.7 F (37.1 C) (Oral)   Resp 16   Ht 5\' 6"  (1.676 m)   Wt 160 lb (72.6 kg)   LMP 03/19/2017 (Approximate)   BMI 25.82 kg/m  Abd: mild Extr: no edema SVE: CERVIX: 5 cm dilated, 70-80 effaced, -1 to -2 station AROM clear fluid  EFM: FHR: 135 bpm, variability: moderate,  accelerations:  Present,  decelerations:  Absent Toco: Frequency: Every 1-3 minutes Labs: I have reviewed the patient's lab results.   Assessment & Plan:  A5W0981G5P4004 @ 5359w1d, admitted for  Pregnancy and Labor/Delivery Management  1. Pain management: position changes 2. FWB: FHT category I  3. ID: GBS negative 4. Labor management: expectant management  All discussed with patient, see orders   Tresea MallJane Ellissa Ayo, CNM [facility], [facility] 12/11/2017  4:47 PM

## 2017-12-11 NOTE — Progress Notes (Signed)
  Labor Progress Note   27 y.o. Z6X0960G5P4004 @ 886w1d , admitted for  Pregnancy, Labor Management. IOL for thrombocytopenia  Subjective:  Patient is feeling contractions and overall coping well. Plan of care discussed. Patient prefers rupture of membranes to pitocin. She understands that we can begin with ROM once fetal station is low enough, but that she may also need pitocin. She agrees to begin with cervical sweep.  Objective:  BP 114/70 (BP Location: Right Arm)   Pulse 74   Temp 98.7 F (37.1 C) (Oral)   Resp 16   Ht 5\' 6"  (1.676 m)   Wt 160 lb (72.6 kg)   LMP 03/19/2017 (Approximate)   BMI 25.82 kg/m  Abd: mild Extr: no edema SVE: CERVIX: 4 cm dilated, 60 effaced, -2 station/cervix posterior and 3 cm prior to sweep   EFM: FHR: 150 bpm, variability: moderate,  accelerations:  Present,  decelerations:  Absent Toco: Frequency: Every 2-4 minutes Labs: I have reviewed the patient's lab results.  Results for Rosario AdieICHOLSON, Earlie N (MRN [id number]) as of 12/11/2017 15:56  Ref. Range 12/11/2017 05:29  WBC Latest Ref Range: 3.6 - 11.0 K/uL 6.4  RBC Latest Ref Range: 3.80 - 5.20 MIL/uL 3.88  Hemoglobin Latest Ref Range: 12.0 - 16.0 g/dL 14.712.8  HCT Latest Ref Range: 35.0 - 47.0 % 36.3  MCV Latest Ref Range: 80.0 - 100.0 fL 93.5  MCH Latest Ref Range: 26.0 - 34.0 pg 32.9  MCHC Latest Ref Range: 32.0 - 36.0 g/dL 82.935.2  RDW Latest Ref Range: 11.5 - 14.5 % 13.7  Platelets Latest Ref Range: 150 - 440 K/uL 78 (L)     Assessment & Plan:  [id number] @ 3286w1d, admitted for  Pregnancy and Labor/Delivery Management  1. Pain management: position changes. 2. FWB: FHT category I.  3. ID: GBS negative 4. Labor management: reassess for AROM option if contractions space out after cervical sweep  All discussed with patient, see orders   Tresea MallJane Rush Salce, CNM [facility], [facility] 12/11/2017  3:46 PM

## 2017-12-11 NOTE — Discharge Instructions (Signed)
Vaginal Delivery, Care After °Refer to this sheet in the next few weeks. These instructions provide you with information about caring for yourself after vaginal delivery. Your health care provider may also give you more specific instructions. Your treatment has been planned according to current medical practices, but problems sometimes occur. Call your health care provider if you have any problems or questions. °What can I expect after the procedure? °After vaginal delivery, it is common to have: °· Some bleeding from your vagina. °· Soreness in your abdomen, your vagina, and the area of skin between your vaginal opening and your anus (perineum). °· Pelvic cramps. °· Fatigue. ° °Follow these instructions at home: °Medicines °· Take over-the-counter and prescription medicines only as told by your health care provider. °· If you were prescribed an antibiotic medicine, take it as told by your health care provider. Do not stop taking the antibiotic until it is finished. °Driving ° °· Do not drive or operate heavy machinery while taking prescription pain medicine. °· Do not drive for 24 hours if you received a sedative. °Lifestyle °· Do not drink alcohol. This is especially important if you are breastfeeding or taking medicine to relieve pain. °· Do not use tobacco products, including cigarettes, chewing tobacco, or e-cigarettes. If you need help quitting, ask your health care provider. °Eating and drinking °· Drink at least 8 eight-ounce glasses of water every day unless you are told not to by your health care provider. If you choose to breastfeed your baby, you may need to drink more water than this. °· Eat high-fiber foods every day. These foods may help prevent or relieve constipation. High-fiber foods include: °? Whole grain cereals and breads. °? Brown rice. °? Beans. °? Fresh fruits and vegetables. °Activity °· Return to your normal activities as told by your health care provider. Ask your health care provider  what activities are safe for you. °· Rest as much as possible. Try to rest or take a nap when your baby is sleeping. °· Do not lift anything that is heavier than your baby or 10 lb (4.5 kg) until your health care provider says that it is safe. °· Talk with your health care provider about when you can engage in sexual activity. This may depend on your: °? Risk of infection. °? Rate of healing. °? Comfort and desire to engage in sexual activity. °Vaginal Care °· If you have an episiotomy or a vaginal tear, check the area every day for signs of infection. Check for: °? More redness, swelling, or pain. °? More fluid or blood. °? Warmth. °? Pus or a bad smell. °· Do not use tampons or douches until your health care provider says this is safe. °· Watch for any blood clots that may pass from your vagina. These may look like clumps of dark red, brown, or black discharge. °General instructions °· Keep your perineum clean and dry as told by your health care provider. °· Wear loose, comfortable clothing. °· Wipe from front to back when you use the toilet. °· Ask your health care provider if you can shower or take a bath. If you had an episiotomy or a perineal tear during labor and delivery, your health care provider may tell you not to take baths for a certain length of time. °· Wear a bra that supports your breasts and fits you well. °· If possible, have someone help you with household activities and help care for your baby for at least a few days after   you leave the hospital. °· Keep all follow-up visits for you and your baby as told by your health care provider. This is important. °Contact a health care provider if: °· You have: °? Vaginal discharge that has a bad smell. °? Difficulty urinating. °? Pain when urinating. °? A sudden increase or decrease in the frequency of your bowel movements. °? More redness, swelling, or pain around your episiotomy or vaginal tear. °? More fluid or blood coming from your episiotomy or  vaginal tear. °? Pus or a bad smell coming from your episiotomy or vaginal tear. °? A fever. °? A rash. °? Little or no interest in activities you used to enjoy. °? Questions about caring for yourself or your baby. °· Your episiotomy or vaginal tear feels warm to the touch. °· Your episiotomy or vaginal tear is separating or does not appear to be healing. °· Your breasts are painful, hard, or turn red. °· You feel unusually sad or worried. °· You feel nauseous or you vomit. °· You pass large blood clots from your vagina. If you pass a blood clot from your vagina, save it to show to your health care provider. Do not flush blood clots down the toilet without having your health care provider look at them. °· You urinate more than usual. °· You are dizzy or light-headed. °· You have not breastfed at all and you have not had a menstrual period for 12 weeks after delivery. °· You have stopped breastfeeding and you have not had a menstrual period for 12 weeks after you stopped breastfeeding. °Get help right away if: °· You have: °? Pain that does not go away or does not get better with medicine. °? Chest pain. °? Difficulty breathing. °? Blurred vision or spots in your vision. °? Thoughts about hurting yourself or your baby. °· You develop pain in your abdomen or in one of your legs. °· You develop a severe headache. °· You faint. °· You bleed from your vagina so much that you fill two sanitary pads in one hour. °This information is not intended to replace advice given to you by your health care provider. Make sure you discuss any questions you have with your health care provider. °Document Released: 06/12/2000 Document Revised: 11/27/2015 Document Reviewed: 06/30/2015 °Elsevier Interactive Patient Education © 2018 [employer] ° °

## 2017-12-11 NOTE — H&P (Signed)
History and Physical Interval Note:  12/11/2017 7:38 AM  Carolyn Chavez  has presented today for INDUCTION OF LABOR (scheduled for Thrombocytopenia, to start with cervical ripening agents),  with the diagnosis of Favorable cervix at term and Thrombocytopenia. The various methods of treatment have been discussed with the patient and family. After consideration of risks, benefits and other options for treatment, the patient has consented to  Labor induction .  The patient's history has been reviewed, patient examined, no change in status, and is stable for induction as planned.  See H&P. I have reviewed the patient's chart and labs.  Questions were answered to the patient's satisfaction.    Platelet count improved at 78,000 this am.   Desires tubal for contraception, plan as interval due to thrombocytopenia. Cytotec placed, Pitocin discussed as later option.  Annamarie MajorPaul Deanglo Hissong, MD, Merlinda FrederickFACOG [facility], [facility] 12/11/2017  7:38 AM

## 2017-12-12 LAB — CBC
HEMATOCRIT: 35.1 % (ref 35.0–47.0)
Hemoglobin: 12.5 g/dL (ref 12.0–16.0)
MCH: 33.5 pg (ref 26.0–34.0)
MCHC: 35.6 g/dL (ref 32.0–36.0)
MCV: 94.4 fL (ref 80.0–100.0)
Platelets: 74 10*3/uL — ABNORMAL LOW (ref 150–440)
RBC: 3.72 MIL/uL — ABNORMAL LOW (ref 3.80–5.20)
RDW: 14 % (ref 11.5–14.5)
WBC: 8.2 10*3/uL (ref 3.6–11.0)

## 2017-12-12 NOTE — Progress Notes (Signed)
Post Partum Day 1 Subjective: Having uterine cramping especially while breastfeeding. Reassurance given of normal and suggested ibuprofen and K Pad for comfort. She is ambulating and voiding without difficulty. She is tolerating PO intake and her pain is well controlled with PO PRN medication.   No CP SOB F/C N/V or leg pain No HA, change of vision, RUQ/epigastric pain  Objective: BP 110/65   Pulse 65   Temp 98 F (36.7 C) (Oral)   Resp 18   Ht 5\' 6"  (1.676 m)   Wt 160 lb (72.6 kg)   LMP 03/19/2017 (Approximate)   SpO2 98%   Breastfeeding   BMI 25.82 kg/m    Physical Exam:  General: NAD CV: RRR Pulm: nl effort, CTABL Lochia: moderate Uterine Fundus: fundus firm and below umbilicus DVT Evaluation: no cords, ttp LEs   Recent Labs    12/11/17 0529 12/12/17 0535  HGB 12.8 12.5  HCT 36.3 35.1  WBC 6.4 8.2  PLT 78* 74*    Assessment/Plan: 27 y.o. [id number] postpartum day # 1  1. Continue routine postpartum care 2. O positive, Rubella Immune, Varicella Immune 3. TDAP status: declined 4. Breastfeeding/Contraception: planned interval tubal 5. Disposition: discharge to home tomorrow   Carolyn MallJane Channelle Chavez, CNM

## 2017-12-12 NOTE — Lactation Note (Signed)
This note was copied from a baby's chart. Lactation Consultation Note  Patient Name: Carolyn Nyoka CowdenDaja Oakley HYQMV'HToday's Date: 12/12/2017     Maternal Data    Feeding Feeding Type: Breast Fed  LATCH Score Latch: Grasps breast easily, tongue down, lips flanged, rhythmical sucking.  Audible Swallowing: Spontaneous and intermittent  Type of Nipple: Everted at rest and after stimulation  Comfort (Breast/Nipple): Filling, red/small blisters or bruises, mild/mod discomfort  Hold (Positioning): No assistance needed to correctly position infant at breast.  LATCH Score: 9  Interventions    Lactation Tools Discussed/Used     Consult Status      Louis MeckelWilliams, Molley Houser Kay 12/12/2017, 11:20 PM

## 2017-12-13 ENCOUNTER — Inpatient Hospital Stay: Payer: Medicaid Other

## 2017-12-13 LAB — RPR: RPR: NONREACTIVE

## 2017-12-13 MED ORDER — IBUPROFEN 600 MG PO TABS: 600.0000 mg | ORAL_TABLET | Freq: Four times a day (QID) | ORAL | 0 refills | Status: DC | PRN

## 2017-12-13 MED ORDER — ACETAMINOPHEN 325 MG PO TABS: 650.0000 mg | ORAL_TABLET | Freq: Four times a day (QID) | ORAL | 0 refills | Status: DC | PRN

## 2017-12-13 NOTE — Progress Notes (Signed)
Reviewed all patients discharge instructions and handouts regarding postpartum bleeding, no intercourse for 6 weeks, signs and symptoms of mastitis and postpartum bleu's. Reviewed discharge instructions for newborn regarding proper cord care, how and when to bathe the newborn, nail care, proper way to take the baby's temperature, along with safe sleep. Concerns have been addressed at this time. Patient discharged via wheelchair with axillary.

## 2017-12-13 NOTE — Lactation Note (Signed)
This note was copied from a baby's chart. Lactation Consultation Note  Patient Name: Carolyn Chavez AVWUJ'WToday's Date: 12/13/2017 Reason for consult: Follow-up assessment;Early term 6037-38.6wks Mom continues to do well with breast feeding without assistance.  Answered lactation discharge questions and discussed follow up breast feeding resources if needed.    Maternal Data Formula Feeding for Exclusion: No Has patient been taught Hand Expression?: Yes Does the patient have breastfeeding experience prior to this delivery?: Yes  Feeding Feeding Type: Breast Fed Length of feed: 10 min  LATCH Score Latch: Grasps breast easily, tongue down, lips flanged, rhythmical sucking.  Audible Swallowing: Spontaneous and intermittent  Type of Nipple: Everted at rest and after stimulation  Comfort (Breast/Nipple): Filling, red/small blisters or bruises, mild/mod discomfort  Hold (Positioning): No assistance needed to correctly position infant at breast.  LATCH Score: 9  Interventions Interventions: Support pillows;Coconut oil;Comfort gels;Breast compression  Lactation Tools Discussed/Used Tools: Coconut oil;Comfort gels WIC Program: Yes   Consult Status Consult Status: PRN Follow-up type: Call as needed    Louis MeckelWilliams, Carolyn Chavez 12/13/2017, 12:38 PM

## 2018-01-31 ENCOUNTER — Inpatient Hospital Stay: Payer: Medicaid Other | Attending: Oncology | Admitting: Oncology

## 2018-01-31 ENCOUNTER — Inpatient Hospital Stay: Payer: Medicaid Other

## 2020-05-08 ENCOUNTER — Other Ambulatory Visit: Payer: Self-pay

## 2020-05-08 ENCOUNTER — Ambulatory Visit (LOCAL_COMMUNITY_HEALTH_CENTER): Payer: Self-pay

## 2020-05-08 VITALS — BP 107/67 | Ht 66.0 in | Wt 132.0 lb

## 2020-05-08 DIAGNOSIS — Z3201 Encounter for pregnancy test, result positive: Secondary | ICD-10-CM

## 2020-05-08 LAB — PREGNANCY, URINE: Preg Test, Ur: POSITIVE — AB

## 2020-05-08 MED ORDER — PRENATAL VITAMIN 27-0.8 MG PO TABS: 1.0000 | ORAL_TABLET | Freq: Every day | ORAL | 0 refills | Status: AC

## 2020-05-08 NOTE — Progress Notes (Signed)
Pt unsure of where she wants to go for prenatal care; sent to preadmit.

## 2020-05-22 ENCOUNTER — Other Ambulatory Visit: Payer: Self-pay

## 2020-05-22 ENCOUNTER — Encounter: Payer: Self-pay | Admitting: Obstetrics & Gynecology

## 2020-05-22 ENCOUNTER — Ambulatory Visit (INDEPENDENT_AMBULATORY_CARE_PROVIDER_SITE_OTHER): Payer: Medicaid Other | Admitting: Obstetrics & Gynecology

## 2020-05-22 VITALS — BP 110/58 | Ht 66.0 in | Wt 135.0 lb

## 2020-05-22 DIAGNOSIS — K429 Umbilical hernia without obstruction or gangrene: Secondary | ICD-10-CM | POA: Insufficient documentation

## 2020-05-22 DIAGNOSIS — N926 Irregular menstruation, unspecified: Secondary | ICD-10-CM | POA: Diagnosis not present

## 2020-05-22 NOTE — Progress Notes (Signed)
Obstetric Problem Visit    Chief Complaint:  ER f/u umbilical hernia and missed period Chief Complaint  Patient presents with  . Follow-up    ER Hernia   History of Present Illness: Patient is a 29 y.o. [id number] [redacted]w[redacted]d presenting for f/u from ER visit for umbilical hernia. Patient is currently [redacted]w[redacted]d pregnant with IUP confirmed on [place] in ER. Patient denies further issue with hernia being un-reduceable since presentation to ER. Patient denies pain today.   Is bleeding equal to or greater than normal menstrual flow:  No Any recent trauma:  No Recent intercourse:  No History of prior miscarriage:  No Prior ultrasound demonstrating IUP: emergency room visit on 05/20/20.  Prior ultrasound demonstrating viable IUP:  Yes Prior Serum HCG:  Yes Rh status: O POS   Review of Systems: Review of Systems  Constitutional: Negative.   Respiratory: Negative.   Gastrointestinal: Positive for constipation. Negative for abdominal pain, nausea and vomiting.  Skin: Negative.   Neurological: Negative.     Past Medical History:  Patient Active Problem List   Diagnosis Date Noted  . Umbilical hernia without obstruction and without gangrene 05/22/2020  . Thrombocytopenia (HCC) 12/11/2017  . Encounter for induction of labor 12/11/2017  . Thrombocytopenia affecting pregnancy, antepartum (HCC) 09/28/2017    Suspected ITP, will need retest of plt count 1-3 months postpartum    . 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 Iron deficiency anemia 09/27/2017  . Anemia affecting pregnancy in second trimester 07/30/2017  . Supervision of high risk pregnancy, antepartum 05/25/2017    Clinic [facility] Prenatal Labs  Dating LMP unsure, dating by 9 week [place] Blood type: O/Positive/-- (11/27 1039)   Genetic Screen 1 Screen: Negative, msAFP - declined   Antibody:Negative (11/27 1039)  Anatomic [place] IComplete Rubella: 3.55 (11/27 1039) Varicella: Immune  GTT Third trimester: 125 RPR: Non Reactive (11/27 1039)   Rhogam n/a HBsAg:  Negative (11/27 1039)   TDaP vaccine                        Flu Shot: declines HIV: NR  Baby Food  breast                              GBS:   Contraception  Given information Pap: 04/2017 NILM  CBB  Given information   CS/VBAC Not applicable   Support Person           . Postpartum care following vaginal delivery 12/23/2014    Past Surgical History:  Past Surgical History:  Procedure Laterality Date  . COLPOSCOPY      Obstetric History: [id number]  Family History:  Family History  Problem Relation Age of Onset  . Cancer Maternal Grandfather 60       lung, brain  . Hypertension Maternal Grandfather   . Skin cancer Maternal Grandfather   . Schizophrenia Brother   . Hearing loss Daughter        2/16; oldest daughter; low freq loss    Social History:  Social History   Socioeconomic History  . Marital status: Married    Spouse name: Not on file  . Number of children: 4  . Years of education: 39  . Highest education level: Not on file  Occupational History  . Not on file  Tobacco Use  . Smoking status: Former Smoker    Quit date: 03/22/2014    Years since quitting: 6.1  .  Smokeless tobacco: Never Used  Vaping Use  . Vaping Use: Never used  Substance and Sexual Activity  . Alcohol use: Not Currently  . Drug use: No  . Sexual activity: Yes    Birth control/protection: Other-see comments  Other Topics Concern  . Not on file  Social History Narrative  . Not on file   Social Determinants of Health   Financial Resource Strain:   . Difficulty of Paying Living Expenses: Not on file  Food Insecurity:   . Worried About Programme researcher, broadcasting/film/video in the Last Year: Not on file  . Ran Out of Food in the Last Year: Not on file  Transportation Needs:   . Lack of Transportation (Medical): Not on file  . Lack of Transportation (Non-Medical): Not on file  Physical Activity:   . Days of Exercise per Week: Not on file  . Minutes of Exercise per Session: Not on file  Stress:   .  Feeling of Stress : Not on file  Social Connections:   . Frequency of Communication with Friends and Family: Not on file  . Frequency of Social Gatherings with Friends and Family: Not on file  . Attends Religious Services: Not on file  . Active Member of Clubs or Organizations: Not on file  . Attends Banker Meetings: Not on file  . Marital Status: Not on file  Intimate Partner Violence: Not At Risk  . Fear of Current or Ex-Partner: No  . Emotionally Abused: No  . Physically Abused: No  . Sexually Abused: No    Allergies:  No Known Allergies  Medications: Prior to Admission medications   Medication Sig Start Date End Date Taking? Authorizing Provider  cephALEXin (KEFLEX) 500 MG capsule Take by mouth. 05/20/20 05/25/20 Yes [provider]  acetaminophen (TYLENOL) 325 MG tablet Take 2 tablets (650 mg total) by mouth every 6 (six) hours as needed for mild pain or moderate pain. Patient not taking: Reported on 05/08/2020 12/13/17   Farrel Conners, CNM  ibuprofen (ADVIL,MOTRIN) 600 MG tablet Take 1 tablet (600 mg total) by mouth every 6 (six) hours as needed for mild pain, moderate pain or cramping. Patient not taking: Reported on 05/08/2020 12/13/17   Farrel Conners, CNM  Prenatal Vit-Fe Fumarate-FA (PRENATAL MULTIVITAMIN) TABS tablet Take 1 tablet by mouth daily at 12 noon.     [provider]  Prenatal Vit-Fe Fumarate-FA (PRENATAL VITAMIN) 27-0.8 MG TABS Take 1 tablet by mouth daily. Patient not taking: Reported on 05/08/2020 05/08/20 08/16/20  Federico Flake, MD  vitamin B-12 (CYANOCOBALAMIN) 1000 MCG tablet Take 1,000 mcg by mouth daily. Patient not taking: Reported on 05/08/2020    [provider]    Physical Exam Vitals: Blood pressure (!) 110/58, height 5\' 6"  (1.676 m), weight 135 lb (61.2 kg), last menstrual period 04/11/2020, unknown if currently breastfeeding. General: NAD HEENT: normocephalic, anicteric Pulmonary: No  increased work of breathing, Abdomen: NABS, soft, non-tender, non-distended. 3cm umbilical hernia noted at LLQ of umbilicus.  No hepatomegaly, splenomegaly or masses palpable. Extremities: no edema, erythema, or tenderness Neurologic: Grossly intact Psychiatric: mood appropriate, affect full  Assessment: 29 y.o. 38 [redacted]w[redacted]d presenting for evaluation of hernia in context of first trimester of pregnancy.  Plan: Problem List Items Addressed This Visit      Other   Umbilical hernia without obstruction and without gangrene - Primary    Other Visit Diagnoses    Missed period         Patient educated to  avoid straining and heavy lifting.  Plan made to RTC for NOB and dating [place].  Dr. Tiburcio Pea evaluated hernia and discussed plan with patient to address with surgical repair following delivery - with potential to complete BTL at the same time.   Zipporah Plants, CNM, MSN [facility], [facility] 05/22/2020, 11:59 AM

## 2020-05-29 ENCOUNTER — Encounter: Payer: Medicaid Other | Admitting: Obstetrics and Gynecology

## 2020-06-06 ENCOUNTER — Other Ambulatory Visit: Payer: Medicaid Other

## 2020-06-06 ENCOUNTER — Other Ambulatory Visit (HOSPITAL_COMMUNITY)
Admission: RE | Admit: 2020-06-06 | Discharge: 2020-06-06 | Disposition: A | Discharge status: Home / Self Care | Payer: Medicaid Other | Source: Ambulatory Visit | Attending: Obstetrics and Gynecology | Admitting: Obstetrics and Gynecology

## 2020-06-06 ENCOUNTER — Ambulatory Visit (INDEPENDENT_AMBULATORY_CARE_PROVIDER_SITE_OTHER): Payer: Medicaid Other

## 2020-06-06 ENCOUNTER — Encounter: Payer: Self-pay | Admitting: Obstetrics and Gynecology

## 2020-06-06 ENCOUNTER — Other Ambulatory Visit: Payer: Self-pay

## 2020-06-06 ENCOUNTER — Other Ambulatory Visit: Payer: Self-pay | Admitting: Obstetrics and Gynecology

## 2020-06-06 ENCOUNTER — Ambulatory Visit (INDEPENDENT_AMBULATORY_CARE_PROVIDER_SITE_OTHER): Payer: Medicaid Other | Admitting: Obstetrics and Gynecology

## 2020-06-06 VITALS — BP 100/60 | Wt 138.0 lb

## 2020-06-06 DIAGNOSIS — Z124 Encounter for screening for malignant neoplasm of cervix: Secondary | ICD-10-CM

## 2020-06-06 DIAGNOSIS — Z3A08 8 weeks gestation of pregnancy: Secondary | ICD-10-CM

## 2020-06-06 DIAGNOSIS — Z3480 Encounter for supervision of other normal pregnancy, unspecified trimester: Secondary | ICD-10-CM

## 2020-06-06 DIAGNOSIS — O0990 Supervision of high risk pregnancy, unspecified, unspecified trimester: Secondary | ICD-10-CM | POA: Insufficient documentation

## 2020-06-06 DIAGNOSIS — Z862 Personal history of diseases of the blood and blood-forming organs and certain disorders involving the immune mechanism: Secondary | ICD-10-CM

## 2020-06-06 DIAGNOSIS — N926 Irregular menstruation, unspecified: Secondary | ICD-10-CM | POA: Diagnosis not present

## 2020-06-06 DIAGNOSIS — Z7185 Encounter for immunization safety counseling: Secondary | ICD-10-CM

## 2020-06-06 DIAGNOSIS — Z113 Encounter for screening for infections with a predominantly sexual mode of transmission: Secondary | ICD-10-CM | POA: Diagnosis not present

## 2020-06-06 DIAGNOSIS — Z369 Encounter for antenatal screening, unspecified: Secondary | ICD-10-CM

## 2020-06-06 MED ORDER — PROVIDA OB 20-20-1.25 MG PO CAPS: 1.0000 | ORAL_CAPSULE | Freq: Every day | ORAL | 4 refills | Status: AC

## 2020-06-06 NOTE — Progress Notes (Signed)
New Obstetric Patient H&P    Chief Complaint: Missed period and +UPT   History of Present Illness: Patient is a 29 y.o. [id number] Not Hispanic or Latino female, presents with amenorrhea and positive home pregnancy test. Patient's last menstrual period was 04/11/2020 (exact date). and based on her  LMP, her EDD is Estimated Date of Delivery: 01/16/21 and her EGA is [redacted]w[redacted]d. Cycles are 3 days, regular, and occur approximately every : 28 days. Her last pap smear was 3 years ago and there were no abnormalities.    She had a urine pregnancy test which was positive 4 or 5 week(s)  ago. Her last menstrual period was normal and lasted for  3 day(s). Since her LMP she claims she has experienced increase in constipation, denies further S&S of pregnancy. She denies vaginal bleeding. Her past medical history is notable for thrombocytopenia. Her prior pregnancies are notable for NSVD x5, pelvis proven to 8lbs. Gestational thrombocytopenia with G4 and G5 (plts from 50-80). Patient denies current S&S of easy bruising or frequent bleeding.  Since her LMP, she admits to the use of tobacco products  no She denies weight gain since the start of pregnancy. There are cats in the home in the home  no  She admits close contact with children on a regular basis  yes  She has had chicken pox in the past yes She has had Tuberculosis exposures, symptoms, or previously tested positive for TB   no Current or past history of domestic violence. no  Genetic Screening/Teratology Counseling: (Includes patient, baby's father, or anyone in either family with:)   1. Patient's age >/= 29 [facility]  no 2. Thalassemia ([place], [place], Mediterranean, or Asian background): MCV<80  no 3. Neural tube defect (meningomyelocele, spina bifida, anencephaly)  no 4. Congenital heart defect  no  5. Down syndrome  no 6. Tay-Sachs (Jewish, [place]  no 7. Canavan's Disease  no 8. Sickle cell disease or trait (African)  no  9. Hemophilia or  other blood disorders  no  10. Muscular dystrophy  no  11. Cystic fibrosis  no  12. Huntington's Chorea  no  13. Mental retardation/autism  no 14. Other inherited genetic or chromosomal disorder  no 15. Maternal metabolic disorder (DM, PKU, etc)  no 16. Patient or FOB with a child with a birth defect not listed above no  16a. Patient or FOB with a birth defect themselves no 17. Recurrent pregnancy loss, or stillbirth  no  18. Any medications since LMP other than prenatal vitamins (include vitamins, supplements, OTC meds, drugs, alcohol)  no 19. Any other genetic/environmental exposure to discuss  no  Infection History:   1. Lives with someone with TB or TB exposed  no  2. Patient or partner has history of genital herpes  no 3. Rash or viral illness since LMP  no 4. History of STI (GC, CT, HPV, syphilis, HIV)  no 5. 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 History of thrombocytopenia 06/06/2020  . Supervision of other normal pregnancy, antepartum 06/06/2020    Clinic [facility] Prenatal Labs  Dating  LMP and 8w [place] Blood type:     Genetic Screen   NIPS: Antibody:   Anatomic [place]  Rubella:   Varicella: @VZVIGG @  GTT Early:               Third trimester:  RPR:     Rhogam  HBsAg:     TDaP vaccine                       Flu Shot: HIV:     Baby Food                                GBS:   Contraception  Pap: collected 06/06/20  CBB     CS/VBAC    Support Person         . Umbilical hernia without obstruction and without gangrene 05/22/2020  . Iron deficiency anemia 09/27/2017    Past Surgical History:  Past Surgical History:  Procedure Laterality Date  . COLPOSCOPY      Gynecologic History: Patient's last menstrual period was 04/11/2020 (exact date).  Obstetric  History: [id number]  Family History:  Family History  Problem Relation Age of Onset  . Cancer Maternal Grandfather 60       lung, brain  . Hypertension Maternal Grandfather   . Skin cancer Maternal Grandfather   . Schizophrenia Brother   . Hearing loss Daughter        2/16; oldest daughter; low freq loss    Social History:  Social History   Socioeconomic History  . Marital status: Married    Spouse name: Not on file  . Number of children: 4  . Years of education: 25  . Highest education level: Not on file  Occupational History  . Not on file  Tobacco Use  . Smoking status: Former Smoker    Quit date: 03/22/2014    Years since quitting: 6.2  . Smokeless tobacco: Never Used  Vaping Use  . Vaping Use: Never used  Substance and Sexual Activity  . Alcohol use: Not Currently  . Drug use: No  . Sexual activity: Yes    Birth control/protection: Other-see comments  Other Topics Concern  . Not on file  Social History Narrative  . Not on file   Social Determinants of Health   Financial Resource Strain: Not on file  Food Insecurity: Not on file  Transportation Needs: Not on file  Physical Activity: Not on file  Stress: Not on file  Social Connections: Not on file  Intimate Partner Violence: Not At Risk  . Fear of Current or Ex-Partner: No  . Emotionally Abused: No  . Physically Abused: No  . Sexually Abused: No    Allergies:  No Known Allergies  Medications: Prior to Admission medications   Medication Sig Start Date End Date Taking? Authorizing Provider  Prenatal Vit-Fe Fumarate-FA (PRENATAL MULTIVITAMIN) TABS tablet Take 1 tablet by mouth daily at 12 noon.    Yes [provider]  acetaminophen (TYLENOL) 325 MG tablet Take 2 tablets (650 mg total) by mouth every 6 (six) hours as needed for mild pain or moderate pain. Patient not taking: Reported on 06/06/2020 12/13/17   Farrel Conners, CNM  ibuprofen (ADVIL,MOTRIN) 600 MG tablet Take 1 tablet (600 mg  total) by mouth every 6 (six) hours as needed for mild pain, moderate pain or cramping. Patient not taking: Reported on 06/06/2020 12/13/17   Farrel Conners, CNM  Prenat w/o A Vit-FeFum-FePo-FA (PROVIDA OB) 20-20-1.25 MG CAPS Take 1 capsule by mouth daily. 06/06/20 07/06/20  Zipporah Plants, CNM  Prenatal Vit-Fe Fumarate-FA (PRENATAL  VITAMIN) 27-0.8 MG TABS Take 1 tablet by mouth daily. Patient not taking: Reported on 06/06/2020 05/08/20 08/16/20  Federico Flake, MD  vitamin B-12 (CYANOCOBALAMIN) 1000 MCG tablet Take 1,000 mcg by mouth daily. Patient not taking: Reported on 06/06/2020    [provider]    Physical Exam Vitals: Blood pressure 100/60, weight 138 lb (62.6 kg), last menstrual period 04/11/2020, unknown if currently breastfeeding.  General: NAD HEENT: normocephalic, anicteric Thyroid: no enlargement, no palpable nodules Pulmonary: No increased work of breathing, CTAB Cardiovascular: RRR, distal pulses 2+ Abdomen: NABS, soft, non-tender, non-distended.  Umbilical hernia noted. Currently reduceable.  No hepatomegaly, splenomegaly or masses palpable.  Genitourinary:  External: Normal external female genitalia.  Normal urethral meatus, normal  Bartholin's and Skene's glands.    Vagina: Normal vaginal mucosa, no evidence of prolapse.    Cervix: Grossly normal in appearance, friable with collection of pap  Uterus: Size consistent with dates, mobile, normal contour.  No CMT  Adnexa: ovaries non-enlarged, no adnexal masses  Rectal: deferred Extremities: no edema, erythema, or tenderness Neurologic: Grossly intact Psychiatric: mood appropriate, affect full   Assessment: 29 y.o. G6P5005 at [redacted]w[redacted]d presenting to initiate prenatal care  Plan: 1)Hx of thrombocytopenia - CBC today - will refer to hematology 2) Avoid alcoholic beverages. 3) Patient encouraged not to smoke.  4) Discontinue the use of all non-medicinal drugs and chemicals.  5) Take prenatal vitamins daily.   6) Nutrition, food safety (fish, cheese advisories, and high nitrite foods) and exercise discussed. 7) Hospital and practice style discussed with cross coverage system.  8) Genetic Screening, such as with 1st Trimester Screening, cell free fetal DNA, AFP testing, and Ultrasound, as well as with amniocentesis and CVS as appropriate, is discussed with patient. At the conclusion of today's visit patient undecided genetic testing 9) Patient is asked about travel to areas at risk for the [place] virus, and counseled to avoid travel and exposure to mosquitoes or sexual partners who may have themselves been exposed to the virus. Testing is discussed, and will be ordered as appropriate.   Zipporah Plants, CNM, MSN [facility], [facility] 06/06/2020, 11:57 AM

## 2020-06-07 LAB — RPR+RH+ABO+RUB AB+AB SCR+CB...
Antibody Screen: NEGATIVE
HIV Screen 4th Generation wRfx: NONREACTIVE
Hematocrit: 39.5 % (ref 34.0–46.6)
Hemoglobin: 12.7 g/dL (ref 11.1–15.9)
Hepatitis B Surface Ag: NEGATIVE
MCH: 27.1 pg (ref 26.6–33.0)
MCHC: 32.2 g/dL (ref 31.5–35.7)
MCV: 84 fL (ref 79–97)
Platelets: 155 10*3/uL (ref 150–450)
RBC: 4.68 x10E6/uL (ref 3.77–5.28)
RDW: 19.9 % — ABNORMAL HIGH (ref 11.7–15.4)
RPR Ser Ql: NONREACTIVE
Rh Factor: POSITIVE
Rubella Antibodies, IGG: 3.79 index (ref >0.99)
Varicella zoster IgG: 743 index (ref >165)
WBC: 5.3 10*3/uL (ref 3.4–10.8)

## 2020-06-07 NOTE — Addendum Note (Signed)
Addended byZipporah Plants on: 06/07/2020 02:01 PM   Modules accepted: Orders

## 2020-06-09 LAB — URINE CULTURE

## 2020-06-10 LAB — CYTOLOGY - PAP
Chlamydia: NEGATIVE
Comment: NEGATIVE
Comment: NEGATIVE
Comment: NORMAL
Diagnosis: NEGATIVE
High risk HPV: NEGATIVE
Neisseria Gonorrhea: NEGATIVE

## 2020-06-10 LAB — URINE DRUG PANEL 7
Amphetamines, Urine: NEGATIVE ng/mL
Barbiturate Quant, Ur: NEGATIVE ng/mL
Benzodiazepine Quant, Ur: NEGATIVE ng/mL
Cannabinoid Quant, Ur: NEGATIVE ng/mL
Cocaine (Metab.): NEGATIVE ng/mL
Opiate Quant, Ur: NEGATIVE ng/mL
PCP Quant, Ur: NEGATIVE ng/mL

## 2020-06-29 NOTE — L&D Delivery Note (Signed)
Date of delivery: 01/03/2021 Estimated Date of Delivery: 01/16/21 Patient's last menstrual period was 04/11/2020 (exact date). EGA: [redacted]w[redacted]d  Delivery Note At 9:29 AM a viable female was delivered via Vaginal, Spontaneous  Presentation: OA, ROA   APGAR: 8, 9;   Weight: 2840 g, 6#4oz Placenta status: Spontaneous, Intact.   Cord: 3 vessels with the following complications: None.  Cord pH: NA   Called to see patient.  Mom pushed to deliver a viable female infant.  The head followed by shoulders, which delivered without difficulty, and the rest of the body.  No nuchal cord noted.  Baby to mom's chest.  Cord clamped and cut after 3 min delay.  Cord blood obtained.  Placenta delivered spontaneously, intact, with a 3-vessel cord.   All counts correct.  Hemostasis obtained with IV pitocin and fundal massage.    Anesthesia: None Episiotomy: None Lacerations: None Suture Repair:  NA Est. Blood Loss (mL): 200  Mom to postpartum.  Baby to Couplet care / Skin to Skin.  Tresea Mall, CNM 01/03/2021, 11:02 AM

## 2020-07-04 ENCOUNTER — Encounter: Payer: Self-pay | Admitting: Obstetrics and Gynecology

## 2020-07-04 ENCOUNTER — Ambulatory Visit (INDEPENDENT_AMBULATORY_CARE_PROVIDER_SITE_OTHER): Payer: Medicaid Other | Admitting: Obstetrics and Gynecology

## 2020-07-04 ENCOUNTER — Other Ambulatory Visit: Payer: Self-pay

## 2020-07-04 VITALS — BP 106/70 | Ht 66.0 in | Wt 137.6 lb

## 2020-07-04 DIAGNOSIS — Z1379 Encounter for other screening for genetic and chromosomal anomalies: Secondary | ICD-10-CM

## 2020-07-04 DIAGNOSIS — Z3480 Encounter for supervision of other normal pregnancy, unspecified trimester: Secondary | ICD-10-CM

## 2020-07-04 NOTE — Progress Notes (Signed)
Routine Prenatal Care Visit  Subjective  Carolyn Chavez is a 30 y.o. G6P5005 at [redacted]w[redacted]d being seen today for ongoing prenatal care.  She is currently monitored for the following issues for this low-risk pregnancy and has Iron deficiency anemia; Umbilical hernia without obstruction and without gangrene; History of thrombocytopenia; and Supervision of other normal pregnancy, antepartum on their problem list.  ----------------------------------------------------------------------------------- Patient reports no complaints.   Contractions: Not present. Vag. Bleeding: None.  Movement: Absent. Denies leaking of fluid.  ----------------------------------------------------------------------------------- The following portions of the patient's history were reviewed and updated as appropriate: allergies, current medications, past family history, past medical history, past social history, past surgical history and problem list. Problem list updated.   Objective  Blood pressure 106/70, height 5\' 6"  (1.676 m), weight 137 lb 9.6 oz (62.4 kg), last menstrual period 04/11/2020, unknown if currently breastfeeding. Pregravid weight 138 lb (62.6 kg) Total Weight Gain -6.4 oz (-0.181 kg) Urinalysis:      Fetal Status: Fetal Heart Rate (bpm): 166   Movement: Absent     General:  Alert, oriented and cooperative. Patient is in no acute distress.  Skin: Skin is warm and dry. No rash noted.   Cardiovascular: Normal heart rate noted  Respiratory: Normal respiratory effort, no problems with respiration noted  Abdomen: Soft, gravid, appropriate for gestational age. Pain/Pressure: Absent     Pelvic:  Cervical exam deferred        Extremities: Normal range of motion.  Edema: None  Mental Status: Normal mood and affect. Normal behavior. Normal judgment and thought content.     Assessment   30 y.o. 38 at [redacted]w[redacted]d by  01/16/2021, by Last Menstrual Period presenting for routine prenatal visit  Plan    pregnancy6 Problems (from 04/11/20 to present)    Problem Noted Resolved   History of thrombocytopenia 06/06/2020 by 14/02/2020, CNM No   Supervision of other normal pregnancy, antepartum 06/06/2020 by 14/02/2020, CNM No   Overview Addendum 07/04/2020 12:01 PM by 09/01/2020, MD     Nursing Staff Provider  Office Location  [facility] Dating   LMP = 8 wk Natale Milch  Language  English Anatomy US    Flu Vaccine  declines Genetic Screen  NIPS:      TDaP vaccine    Hgb A1C or  GTT Early : Third trimester :   Rhogam  Not needed   LAB RESULTS   Feeding Plan  Blood Type O/Positive/-- (12/09 1155)   Contraception  Antibody Negative (12/09 1155)  Circumcision  Rubella 3.79 (12/09 1155)  Pediatrician   RPR Non Reactive (12/09 1155)   Support Person  HBsAg Negative (12/09 1155)   Prenatal Classes  HIV Non Reactive (12/09 1155)    Varicella Immune  BTL Consent Desires to do outpatient was [facility]  (For PCN allergy, check sensitivities)        VBAC Consent  Pap  2021 NIL    Hgb Electro   negative  Covid Unvaccinated, ecourgared vaccination CF      SMA         Pregnancy related diagnoses History of thrombocytopenia History of precipitous deliveries Umbilical hernia      Previous Version     Encouraged covid vaccination- provided with information  Gestational age appropriate obstetric precautions including but not limited to vaginal bleeding, contractions, leaking of fluid and fetal movement were reviewed in detail with the patient.    Return in about 4 weeks (around 08/01/2020) for ROB in  person.  Natale Milch MD [facility], [facility] 07/04/2020, 11:59 AM

## 2020-07-04 NOTE — Patient Instructions (Signed)
First Trimester of Pregnancy The first trimester of pregnancy is from week 1 until the end of week 13 (months 1 through 3). A week after a sperm fertilizes an egg, the egg will implant on the wall of the uterus. This embryo will begin to develop into a baby. Genes from you and your partner will form the baby. The female genes will determine whether the baby will be a boy or a girl. At 6-8 weeks, the eyes and face will be formed, and the heartbeat can be seen on ultrasound. At the end of 12 weeks, all the baby's organs will be formed. Now that you are pregnant, you will want to do everything you can to have a healthy baby. Two of the most important things are to get good prenatal care and to follow your health care provider's instructions. Prenatal care is all the medical care you receive before the baby's birth. This care will help prevent, find, and treat any problems during the pregnancy and childbirth. Body changes during your first trimester Your body goes through many changes during pregnancy. The changes vary from woman to woman.  You may gain or lose a couple of pounds at first.  You may feel sick to your stomach (nauseous) and you may throw up (vomit). If the vomiting is uncontrollable, call your health care provider.  You may tire easily.  You may develop headaches that can be relieved by medicines. All medicines should be approved by your health care provider.  You may urinate more often. Painful urination may mean you have a bladder infection.  You may develop heartburn as a result of your pregnancy.  You may develop constipation because certain hormones are causing the muscles that push stool through your intestines to slow down.  You may develop hemorrhoids or swollen veins (varicose veins).  Your breasts may begin to grow larger and become tender. Your nipples may stick out more, and the tissue that surrounds them (areola) may become darker.  Your gums may bleed and may be  sensitive to brushing and flossing.  Dark spots or blotches (chloasma, mask of pregnancy) may develop on your face. This will likely fade after the baby is born.  Your menstrual periods will stop.  You may have a loss of appetite.  You may develop cravings for certain kinds of food.  You may have changes in your emotions from day to day, such as being excited to be pregnant or being concerned that something may go wrong with the pregnancy and baby.  You may have more vivid and strange dreams.  You may have changes in your hair. These can include thickening of your hair, rapid growth, and changes in texture. Some women also have hair loss during or after pregnancy, or hair that feels dry or thin. Your hair will most likely return to normal after your baby is born. What to expect at prenatal visits During a routine prenatal visit:  You will be weighed to make sure you and the baby are growing normally.  Your blood pressure will be taken.  Your abdomen will be measured to track your baby's growth.  The fetal heartbeat will be listened to between weeks 10 and 14 of your pregnancy.  Test results from any previous visits will be discussed. Your health care provider may ask you:  How you are feeling.  If you are feeling the baby move.  If you have had any abnormal symptoms, such as leaking fluid, bleeding, severe headaches, or abdominal   cramping.  If you are using any tobacco products, including cigarettes, chewing tobacco, and electronic cigarettes.  If you have any questions. Other tests that may be performed during your first trimester include:  Blood tests to find your blood type and to check for the presence of any previous infections. The tests will also be used to check for low iron levels (anemia) and protein on red blood cells (Rh antibodies). Depending on your risk factors, or if you previously had diabetes during pregnancy, you may have tests to check for high blood sugar  that affects pregnant women (gestational diabetes).  Urine tests to check for infections, diabetes, or protein in the urine.  An ultrasound to confirm the proper growth and development of the baby.  Fetal screens for spinal cord problems (spina bifida) and Down syndrome.  HIV (human immunodeficiency virus) testing. Routine prenatal testing includes screening for HIV, unless you choose not to have this test.  You may need other tests to make sure you and the baby are doing well. Follow these instructions at home: Medicines  Follow your health care provider's instructions regarding medicine use. Specific medicines may be either safe or unsafe to take during pregnancy.  Take a prenatal vitamin that contains at least 600 micrograms (mcg) of folic acid.  If you develop constipation, try taking a stool softener if your health care provider approves. Eating and drinking   Eat a balanced diet that includes fresh fruits and vegetables, whole grains, good sources of protein such as meat, eggs, or tofu, and low-fat dairy. Your health care provider will help you determine the amount of weight gain that is right for you.  Avoid raw meat and uncooked cheese. These carry germs that can cause birth defects in the baby.  Eating four or five small meals rather than three large meals a day may help relieve nausea and vomiting. If you start to feel nauseous, eating a few soda crackers can be helpful. Drinking liquids between meals, instead of during meals, also seems to help ease nausea and vomiting.  Limit foods that are high in fat and processed sugars, such as fried and sweet foods.  To prevent constipation: ? Eat foods that are high in fiber, such as fresh fruits and vegetables, whole grains, and beans. ? Drink enough fluid to keep your urine clear or pale yellow. Activity  Exercise only as directed by your health care provider. Most women can continue their usual exercise routine during  pregnancy. Try to exercise for 30 minutes at least 5 days a week. Exercising will help you: ? Control your weight. ? Stay in shape. ? Be prepared for labor and delivery.  Experiencing pain or cramping in the lower abdomen or lower back is a good sign that you should stop exercising. Check with your health care provider before continuing with normal exercises.  Try to avoid standing for long periods of time. Move your legs often if you must stand in one place for a long time.  Avoid heavy lifting.  Wear low-heeled shoes and practice good posture.  You may continue to have sex unless your health care provider tells you not to. Relieving pain and discomfort  Wear a good support bra to relieve breast tenderness.  Take warm sitz baths to soothe any pain or discomfort caused by hemorrhoids. Use hemorrhoid cream if your health care provider approves.  Rest with your legs elevated if you have leg cramps or low back pain.  If you develop varicose veins in   your legs, wear support hose. Elevate your feet for 15 minutes, 3-4 times a day. Limit salt in your diet. Prenatal care  Schedule your prenatal visits by the twelfth week of pregnancy. They are usually scheduled monthly at first, then more often in the last 2 months before delivery.  Write down your questions. Take them to your prenatal visits.  Keep all your prenatal visits as told by your health care provider. This is important. Safety  Wear your seat belt at all times when driving.  Make a list of emergency phone numbers, including numbers for family, friends, the hospital, and police and fire departments. General instructions  Ask your health care provider for a referral to a local prenatal education class. Begin classes no later than the beginning of month 6 of your pregnancy.  Ask for help if you have counseling or nutritional needs during pregnancy. Your health care provider can offer advice or refer you to specialists for help  with various needs.  Do not use hot tubs, steam rooms, or saunas.  Do not douche or use tampons or scented sanitary pads.  Do not cross your legs for long periods of time.  Avoid cat litter boxes and soil used by cats. These carry germs that can cause birth defects in the baby and possibly loss of the fetus by miscarriage or stillbirth.  Avoid all smoking, herbs, alcohol, and medicines not prescribed by your health care provider. Chemicals in these products affect the formation and growth of the baby.  Do not use any products that contain nicotine or tobacco, such as cigarettes and e-cigarettes. If you need help quitting, ask your health care provider. You may receive counseling support and other resources to help you quit.  Schedule a dentist appointment. At home, brush your teeth with a soft toothbrush and be gentle when you floss. Contact a health care provider if:  You have dizziness.  You have mild pelvic cramps, pelvic pressure, or nagging pain in the abdominal area.  You have persistent nausea, vomiting, or diarrhea.  You have a bad smelling vaginal discharge.  You have pain when you urinate.  You notice increased swelling in your face, hands, legs, or ankles.  You are exposed to fifth disease or chickenpox.  You are exposed to German measles (rubella) and have never had it. Get help right away if:  You have a fever.  You are leaking fluid from your vagina.  You have spotting or bleeding from your vagina.  You have severe abdominal cramping or pain.  You have rapid weight gain or loss.  You vomit blood or material that looks like coffee grounds.  You develop a severe headache.  You have shortness of breath.  You have any kind of trauma, such as from a fall or a car accident. Summary  The first trimester of pregnancy is from week 1 until the end of week 13 (months 1 through 3).  Your body goes through many changes during pregnancy. The changes vary from  woman to woman.  You will have routine prenatal visits. During those visits, your health care provider will examine you, discuss any test results you may have, and talk with you about how you are feeling. This information is not intended to replace advice given to you by your health care provider. Make sure you discuss any questions you have with your health care provider. Document Revised: 05/28/2017 Document Reviewed: 05/27/2016 Elsevier Patient Education  2020 [employer]  

## 2020-07-08 LAB — MATERNIT21 PLUS CORE+SCA
Fetal Fraction: 7
Monosomy X (Turner Syndrome): NOT DETECTED
Result (T21): NEGATIVE
Trisomy 13 (Patau syndrome): NEGATIVE
Trisomy 18 (Edwards syndrome): NEGATIVE
Trisomy 21 (Down syndrome): NEGATIVE
XXX (Triple X Syndrome): NOT DETECTED
XXY (Klinefelter Syndrome): NOT DETECTED
XYY (Jacobs Syndrome): NOT DETECTED

## 2020-08-01 ENCOUNTER — Encounter: Payer: Self-pay | Admitting: Advanced Practice Midwife

## 2020-08-01 ENCOUNTER — Ambulatory Visit (INDEPENDENT_AMBULATORY_CARE_PROVIDER_SITE_OTHER): Payer: Medicaid Other | Admitting: Advanced Practice Midwife

## 2020-08-01 VITALS — BP 120/78 | Wt 141.0 lb

## 2020-08-01 DIAGNOSIS — Z369 Encounter for antenatal screening, unspecified: Secondary | ICD-10-CM

## 2020-08-01 DIAGNOSIS — Z3A16 16 weeks gestation of pregnancy: Secondary | ICD-10-CM

## 2020-08-01 DIAGNOSIS — Z3482 Encounter for supervision of other normal pregnancy, second trimester: Secondary | ICD-10-CM

## 2020-08-01 NOTE — Progress Notes (Signed)
Routine Prenatal Care Visit  Subjective  Carolyn Chavez is a 30 y.o. G6P5005 at [redacted]w[redacted]d being seen today for ongoing prenatal care.  She is currently monitored for the following issues for this low-risk pregnancy and has Iron deficiency anemia; Umbilical hernia without obstruction and without gangrene; History of thrombocytopenia; and Supervision of other normal pregnancy, antepartum on their problem list.  ----------------------------------------------------------------------------------- Patient reports feeling tired. She is encouraged to take PNV with Fe. Her umbilical hernia is occasionally painful.    . Vag. Bleeding: None.  Movement: Present. Leaking Fluid denies.  ----------------------------------------------------------------------------------- The following portions of the patient's history were reviewed and updated as appropriate: allergies, current medications, past family history, past medical history, past social history, past surgical history and problem list. Problem list updated.  Objective  Blood pressure 120/78, weight 141 lb (64 kg), last menstrual period 04/11/2020 Pregravid weight 130 lb (59 kg) Total Weight Gain 11 lb (4.99 kg) Urinalysis: Urine Protein    Urine Glucose    Fetal Status: Fetal Heart Rate (bpm): 150   Movement: Present     General:  Alert, oriented and cooperative. Patient is in no acute distress.  Skin: Skin is warm and dry. No rash noted.   Cardiovascular: Normal heart rate noted  Respiratory: Normal respiratory effort, no problems with respiration noted  Abdomen: Soft, gravid, appropriate for gestational age.       Pelvic:  Cervical exam deferred        Extremities: Normal range of motion.  Edema: None  Mental Status: Normal mood and affect. Normal behavior. Normal judgment and thought content.   Assessment   30 y.o. [id number] at [redacted]w[redacted]d by  01/16/2021, by Last Menstrual Period presenting for routine prenatal visit  Plan   pregnancy6 Problems  (from 04/11/20 to present)    Problem Noted Resolved   History of thrombocytopenia 06/06/2020 by Zipporah Plants, CNM No   Supervision of other normal pregnancy, antepartum 06/06/2020 by Zipporah Plants, CNM No   Overview Addendum 07/04/2020 12:02 PM by Natale Milch, MD     Nursing Staff Provider  Office Location  [facility] Dating   LMP = 8 wk [place]  Language  English Anatomy US    Flu Vaccine  declines Genetic Screen  NIPS:      TDaP vaccine    Hgb A1C or  GTT Early : Third trimester :   Rhogam  Not needed   LAB RESULTS   Feeding Plan  Blood Type O/Positive/-- (12/09 1155)   Contraception  Antibody Negative (12/09 1155)  Circumcision  Rubella 3.79 (12/09 1155)  Pediatrician   RPR Non Reactive (12/09 1155)   Support Person  HBsAg Negative (12/09 1155)   Prenatal Classes  HIV Non Reactive (12/09 1155)    Varicella Immune  BTL Consent Desires to do outpatient was [facility]  (For PCN allergy, check sensitivities)        VBAC Consent  Pap  2021 NIL    Hgb Electro   negative  Covid Unvaccinated, ecourgared vaccination CF      SMA         Pregnancy related diagnoses History of thrombocytopenia in pregnancy History of precipitous deliveries Umbilical hernia      Previous Version       Preterm labor symptoms and general obstetric precautions including but not limited to vaginal bleeding, contractions, leaking of fluid and fetal movement were reviewed in detail with the patient.   Return in about 4 weeks (around 08/29/2020) for anatomy scan and  rob.  Tresea Mall, CNM 08/01/2020 11:37 AM

## 2020-08-28 ENCOUNTER — Ambulatory Visit (INDEPENDENT_AMBULATORY_CARE_PROVIDER_SITE_OTHER): Payer: Medicaid Other

## 2020-08-28 ENCOUNTER — Encounter: Payer: Self-pay | Admitting: Advanced Practice Midwife

## 2020-08-28 ENCOUNTER — Other Ambulatory Visit: Payer: Self-pay

## 2020-08-28 ENCOUNTER — Ambulatory Visit (INDEPENDENT_AMBULATORY_CARE_PROVIDER_SITE_OTHER): Payer: Medicaid Other | Admitting: Advanced Practice Midwife

## 2020-08-28 VITALS — BP 100/60 | Wt 144.0 lb

## 2020-08-28 DIAGNOSIS — Z3482 Encounter for supervision of other normal pregnancy, second trimester: Secondary | ICD-10-CM

## 2020-08-28 DIAGNOSIS — Z862 Personal history of diseases of the blood and blood-forming organs and certain disorders involving the immune mechanism: Secondary | ICD-10-CM

## 2020-08-28 DIAGNOSIS — Z3480 Encounter for supervision of other normal pregnancy, unspecified trimester: Secondary | ICD-10-CM

## 2020-08-28 DIAGNOSIS — Z3A19 19 weeks gestation of pregnancy: Secondary | ICD-10-CM

## 2020-08-28 DIAGNOSIS — Z369 Encounter for antenatal screening, unspecified: Secondary | ICD-10-CM

## 2020-08-28 NOTE — Progress Notes (Signed)
Routine Prenatal Care Visit  Subjective  Carolyn Chavez is a 30 y.o. G6P5005 at [redacted]w[redacted]d being seen today for ongoing prenatal care.  She is currently monitored for the following issues for this low-risk pregnancy and has Iron deficiency anemia; Umbilical hernia without obstruction and without gangrene; History of thrombocytopenia; and Supervision of other normal pregnancy, antepartum on their problem list.  ----------------------------------------------------------------------------------- Patient reports no complaints.  Will check platelets today- history thrombocytopenia. Contractions: Not present. Vag. Bleeding: None.  Movement: Present. Leaking Fluid denies.  ----------------------------------------------------------------------------------- The following portions of the patient's history were reviewed and updated as appropriate: allergies, current medications, past family history, past medical history, past social history, past surgical history and problem list. Problem list updated.  Objective  Blood pressure 100/60, weight 144 lb (65.3 kg), last menstrual period 04/11/2020 Pregravid weight 130 lb (59 kg) Total Weight Gain 14 lb (6.35 kg) Urinalysis: Urine Protein    Urine Glucose    Fetal Status: Fetal Heart Rate (bpm): 148   Movement: Present  Presentation: Transverse   Anatomy scan: incomplete for spine due to transverse position and otherwise complete, normal, female, placenta posterior  General:  Alert, oriented and cooperative. Patient is in no acute distress.  Skin: Skin is warm and dry. No rash noted.   Cardiovascular: Normal heart rate noted  Respiratory: Normal respiratory effort, no problems with respiration noted  Abdomen: Soft, gravid, appropriate for gestational age. Pain/Pressure: Absent     Pelvic:  Cervical exam deferred        Extremities: Normal range of motion.     Mental Status: Normal mood and affect. Normal behavior. Normal judgment and thought content.    Assessment   30 y.o. [id number] at 103w6d by  01/16/2021, by Last Menstrual Period presenting for routine prenatal visit  Plan   pregnancy6 Problems (from 04/11/20 to present)    Problem Noted Resolved   History of thrombocytopenia 06/06/2020 by Zipporah Plants, CNM No   Supervision of other normal pregnancy, antepartum 06/06/2020 by Zipporah Plants, CNM No   Overview Addendum 07/04/2020 12:02 PM by Natale Milch, MD     Nursing Staff Provider  Office Location  [facility] Dating   LMP = 8 wk [place]  Language  English Anatomy US    Flu Vaccine  declines Genetic Screen  NIPS:      TDaP vaccine    Hgb A1C or  GTT Early : Third trimester :   Rhogam  Not needed   LAB RESULTS   Feeding Plan  Blood Type O/Positive/-- (12/09 1155)   Contraception  Antibody Negative (12/09 1155)  Circumcision  Rubella 3.79 (12/09 1155)  Pediatrician   RPR Non Reactive (12/09 1155)   Support Person  HBsAg Negative (12/09 1155)   Prenatal Classes  HIV Non Reactive (12/09 1155)    Varicella Immune  BTL Consent Desires to do outpatient was [facility]  (For PCN allergy, check sensitivities)        VBAC Consent  Pap  2021 NIL    Hgb Electro   negative  Covid Unvaccinated, ecourgared vaccination CF      SMA         Pregnancy related diagnoses History of thrombocytopenia in pregnancy History of precipitous deliveries Umbilical hernia      Previous Version     CBC today for history of thrombocytopenia   Preterm labor symptoms and general obstetric precautions including but not limited to vaginal bleeding, contractions, leaking of fluid and fetal movement were reviewed in detail  with the patient.    Return in about 4 weeks (around 09/25/2020) for follow up anatomy scan and rob.  Tresea Mall, CNM 08/28/2020 11:56 AM

## 2020-08-29 LAB — CBC
Hematocrit: 36.6 % (ref 34.0–46.6)
Hemoglobin: 12.1 g/dL (ref 11.1–15.9)
MCH: 31.3 pg (ref 26.6–33.0)
MCHC: 33.1 g/dL (ref 31.5–35.7)
MCV: 95 fL (ref 79–97)
Platelets: 111 10*3/uL — ABNORMAL LOW (ref 150–450)
RBC: 3.87 x10E6/uL (ref 3.77–5.28)
RDW: 14.8 % (ref 11.7–15.4)
WBC: 7.5 10*3/uL (ref 3.4–10.8)

## 2020-09-25 ENCOUNTER — Encounter: Payer: Medicaid Other | Admitting: Obstetrics

## 2020-09-25 ENCOUNTER — Ambulatory Visit: Payer: Medicaid Other

## 2020-09-25 DIAGNOSIS — Z3480 Encounter for supervision of other normal pregnancy, unspecified trimester: Secondary | ICD-10-CM

## 2020-09-25 DIAGNOSIS — Z369 Encounter for antenatal screening, unspecified: Secondary | ICD-10-CM

## 2020-10-02 ENCOUNTER — Encounter: Payer: Self-pay | Admitting: Obstetrics and Gynecology

## 2020-10-02 ENCOUNTER — Ambulatory Visit (INDEPENDENT_AMBULATORY_CARE_PROVIDER_SITE_OTHER): Payer: Medicaid Other | Admitting: Obstetrics and Gynecology

## 2020-10-02 ENCOUNTER — Ambulatory Visit (INDEPENDENT_AMBULATORY_CARE_PROVIDER_SITE_OTHER): Payer: Medicaid Other

## 2020-10-02 ENCOUNTER — Other Ambulatory Visit: Payer: Self-pay

## 2020-10-02 VITALS — BP 102/64 | Wt 149.0 lb

## 2020-10-02 DIAGNOSIS — Z3480 Encounter for supervision of other normal pregnancy, unspecified trimester: Secondary | ICD-10-CM

## 2020-10-02 DIAGNOSIS — Z369 Encounter for antenatal screening, unspecified: Secondary | ICD-10-CM

## 2020-10-02 DIAGNOSIS — Z862 Personal history of diseases of the blood and blood-forming organs and certain disorders involving the immune mechanism: Secondary | ICD-10-CM

## 2020-10-02 DIAGNOSIS — Z131 Encounter for screening for diabetes mellitus: Secondary | ICD-10-CM

## 2020-10-02 DIAGNOSIS — Z3A24 24 weeks gestation of pregnancy: Secondary | ICD-10-CM

## 2020-10-02 DIAGNOSIS — Z113 Encounter for screening for infections with a predominantly sexual mode of transmission: Secondary | ICD-10-CM

## 2020-10-02 NOTE — Progress Notes (Signed)
Routine Prenatal Care Visit  Subjective  Carolyn Chavez is a 30 y.o. G6P5005 at [redacted]w[redacted]d being seen today for ongoing prenatal care.  She is currently monitored for the following issues for this low-risk pregnancy and has Iron deficiency anemia; Umbilical hernia without obstruction and without gangrene; History of thrombocytopenia; and Supervision of other normal pregnancy, antepartum on their problem list.  ----------------------------------------------------------------------------------- Patient reports no complaints.   Contractions: Not present. Vag. Bleeding: None.  Movement: Present. Leaking Fluid denies.  Anatomy screen complete today ----------------------------------------------------------------------------------- The following portions of the patient's history were reviewed and updated as appropriate: allergies, current medications, past family history, past medical history, past social history, past surgical history and problem list. Problem list updated.  Objective  Blood pressure 102/64, weight 149 lb (67.6 kg), last menstrual period 04/11/2020, unknown if currently breastfeeding. Pregravid weight 130 lb (59 kg) Total Weight Gain 19 lb (8.618 kg) Urinalysis: Urine Protein    Urine Glucose    Fetal Status: Fetal Heart Rate (bpm): 146   Movement: Present     General:  Alert, oriented and cooperative. Patient is in no acute distress.  Skin: Skin is warm and dry. No rash noted.   Cardiovascular: Normal heart rate noted  Respiratory: Normal respiratory effort, no problems with respiration noted  Abdomen: Soft, gravid, appropriate for gestational age. Pain/Pressure: Absent     Pelvic:  Cervical exam deferred        Extremities: Normal range of motion.  Edema: None  Mental Status: Normal mood and affect. Normal behavior. Normal judgment and thought content.   Imaging Results US OB Follow Up  Result Date: 10/02/2020 Patient Name: Carolyn Chavez DOB: 12-15-1990 MRN: [id number]  ULTRASOUND REPORT Location: [facility] Date of Service: 10/02/2020 Indications: Anatomy follow up ultrasound Findings: Carolyn Chavez intrauterine pregnancy is visualized with FHR at 146 BPM. Fetal presentation is Cephalic. Placenta: posterior. Grade: 1 AFI: subjectively normal. Anatomic survey is complete for spine views. There is no free peritoneal fluid in the cul de sac. Impression: 1. [redacted]w[redacted]d Viable Singleton Intrauterine pregnancy previously established criteria. 2. Normal Anatomy Scan is now complete Carolyn Chavez, RT There is a singleton gestation with subjectively normal amniotic fluid volume. Detailed evaluation of the fetal anatomy was performed. The fetal anatomical survey appears within normal limits within the resolution of ultrasound as described above.  Not all structures were attempted to be visualized as they were noted on a previous exam. See the prior exam for full details.  It must be noted that a normal ultrasound is unable to rule out fetal aneuploidy nor is it able to detect all possible malformations.  The ultrasound images and findings were reviewed by me and I agree with the above report. Carolyn Mohair, MD, [facility], [facility] 10/02/2020 3:42 PM       Assessment   30 y.o. [id number] at [redacted]w[redacted]d by  01/16/2021, by Last Menstrual Period presenting for routine prenatal visit  Plan   pregnancy6 Problems (from 04/11/20 to present)    Problem Noted Resolved   History of thrombocytopenia 06/06/2020 by Carolyn Chavez, CNM No   Overview Signed 10/02/2020  3:43 PM by Carolyn Novak, MD    - plts 111 at 20 wk cbc [ ]  recheck @ 28 wks      Supervision of other normal pregnancy, antepartum 06/06/2020 by 14/02/2020, CNM No   Overview Addendum 10/02/2020  3:42 PM by 12/02/2020, MD     Nursing Staff Provider  Office  Location  [facility] Dating   LMP = 8 wk [place]  Language  English Anatomy US  Complete 4/6  Flu Vaccine  declines Genetic Screen  NIPS:      TDaP  vaccine    Hgb A1C or  GTT Early : Third trimester :   Rhogam  Not needed   LAB RESULTS   Feeding Plan  Blood Type O/Positive/-- (12/09 1155)   Contraception  Antibody Negative (12/09 1155)  Circumcision  Rubella 3.79 (12/09 1155)  Pediatrician   RPR Non Reactive (12/09 1155)   Support Person  HBsAg Negative (12/09 1155)   Prenatal Classes  HIV Non Reactive (12/09 1155)    Varicella Immune  BTL Consent Desires to do outpatient was [facility]  (For PCN allergy, check sensitivities)        VBAC Consent  Pap  2021 NIL    Hgb Electro   negative  Covid Unvaccinated, ecourgared vaccination CF      SMA         Pregnancy related diagnoses History of thrombocytopenia in pregnancy History of precipitous deliveries Umbilical hernia      Previous Version       Preterm labor symptoms and general obstetric precautions including but not limited to vaginal bleeding, contractions, leaking of fluid and fetal movement were reviewed in detail with the patient. Please refer to After Visit Summary for other counseling recommendations.   - CBC today  Return in about 3 weeks (around 10/23/2020) for 28 week labs with Routine Prenatal Appointment.   Carolyn Mohair, MD, [facility], [facility] 10/02/2020 3:58 PM

## 2020-10-03 LAB — CBC WITH DIFFERENTIAL/PLATELET
Basophils Absolute: 0 10*3/uL (ref 0.0–0.2)
Basos: 0 %
EOS (ABSOLUTE): 0.1 10*3/uL (ref 0.0–0.4)
Eos: 1 %
Hematocrit: 36 % (ref 34.0–46.6)
Hemoglobin: 12.1 g/dL (ref 11.1–15.9)
Immature Grans (Abs): 0.1 10*3/uL (ref 0.0–0.1)
Immature Granulocytes: 1 %
Lymphocytes Absolute: 1.7 10*3/uL (ref 0.7–3.1)
Lymphs: 19 %
MCH: 31.7 pg (ref 26.6–33.0)
MCHC: 33.6 g/dL (ref 31.5–35.7)
MCV: 94 fL (ref 79–97)
Monocytes Absolute: 0.6 10*3/uL (ref 0.1–0.9)
Monocytes: 7 %
Neutrophils Absolute: 6.2 10*3/uL (ref 1.4–7.0)
Neutrophils: 72 %
Platelets: 87 10*3/uL — CL (ref 150–450)
RBC: 3.82 x10E6/uL (ref 3.77–5.28)
RDW: 13.6 % (ref 11.7–15.4)
WBC: 8.6 10*3/uL (ref 3.4–10.8)

## 2020-10-22 ENCOUNTER — Other Ambulatory Visit: Payer: Self-pay

## 2020-10-22 ENCOUNTER — Ambulatory Visit (INDEPENDENT_AMBULATORY_CARE_PROVIDER_SITE_OTHER): Payer: Medicaid Other | Admitting: Obstetrics and Gynecology

## 2020-10-22 ENCOUNTER — Encounter: Payer: Self-pay | Admitting: Obstetrics and Gynecology

## 2020-10-22 ENCOUNTER — Other Ambulatory Visit: Payer: Medicaid Other

## 2020-10-22 VITALS — BP 114/70 | Wt 153.0 lb

## 2020-10-22 DIAGNOSIS — D508 Other iron deficiency anemias: Secondary | ICD-10-CM

## 2020-10-22 DIAGNOSIS — Z3A27 27 weeks gestation of pregnancy: Secondary | ICD-10-CM

## 2020-10-22 DIAGNOSIS — Z3480 Encounter for supervision of other normal pregnancy, unspecified trimester: Secondary | ICD-10-CM

## 2020-10-22 DIAGNOSIS — Z131 Encounter for screening for diabetes mellitus: Secondary | ICD-10-CM

## 2020-10-22 DIAGNOSIS — Z113 Encounter for screening for infections with a predominantly sexual mode of transmission: Secondary | ICD-10-CM

## 2020-10-22 DIAGNOSIS — D696 Thrombocytopenia, unspecified: Secondary | ICD-10-CM

## 2020-10-22 DIAGNOSIS — O99119 Other diseases of the blood and blood-forming organs and certain disorders involving the immune mechanism complicating pregnancy, unspecified trimester: Secondary | ICD-10-CM | POA: Insufficient documentation

## 2020-10-22 DIAGNOSIS — Z3483 Encounter for supervision of other normal pregnancy, third trimester: Secondary | ICD-10-CM

## 2020-10-22 NOTE — Progress Notes (Signed)
Routine Prenatal Care Visit  Subjective  Carolyn Chavez is a 30 y.o. G6P5005 at [redacted]w[redacted]d being seen today for ongoing prenatal care.  She is currently monitored for the following issues for this high-risk pregnancy and has Iron deficiency anemia; Umbilical hernia without obstruction and without gangrene; History of thrombocytopenia; Supervision of other normal pregnancy, antepartum; and Thrombocytopenia affecting pregnancy, antepartum (HCC) on their problem list.  ----------------------------------------------------------------------------------- Patient reports no complaints.   Contractions: Not present. Vag. Bleeding: None.  Movement: Present. Leaking Fluid denies.  ----------------------------------------------------------------------------------- The following portions of the patient's history were reviewed and updated as appropriate: allergies, current medications, past family history, past medical history, past social history, past surgical history and problem list. Problem list updated.  Objective  Blood pressure 114/70, weight 153 lb (69.4 kg), last menstrual period 04/11/2020, unknown if currently breastfeeding. Pregravid weight 130 lb (59 kg) Total Weight Gain 23 lb (10.4 kg) Urinalysis: Urine Protein    Urine Glucose    Fetal Status: Fetal Heart Rate (bpm): 145 Fundal Height: 27 cm Movement: Present     General:  Alert, oriented and cooperative. Patient is in no acute distress.  Skin: Skin is warm and dry. No rash noted.   Cardiovascular: Normal heart rate noted  Respiratory: Normal respiratory effort, no problems with respiration noted  Abdomen: Soft, gravid, appropriate for gestational age. Pain/Pressure: Absent     Pelvic:  Cervical exam deferred        Extremities: Normal range of motion.  Edema: None  Mental Status: Normal mood and affect. Normal behavior. Normal judgment and thought content.   Assessment   30 y.o. [id number] at [redacted]w[redacted]d by  01/16/2021, by Last Menstrual Period  presenting for routine prenatal visit  Plan   pregnancy6 Problems (from 04/11/20 to present)    Problem Noted Resolved   Thrombocytopenia affecting pregnancy, antepartum (HCC) 10/22/2020 by Conard Novak, MD No   History of thrombocytopenia 06/06/2020 by Zipporah Plants, CNM No   Overview Signed 10/02/2020  3:43 PM by Conard Novak, MD    - plts 111 at 20 wk cbc [ ]  recheck @ 28 wks      Supervision of other normal pregnancy, antepartum 06/06/2020 by 14/02/2020, CNM No   Overview Addendum 10/02/2020  3:42 PM by 12/02/2020, MD     Nursing Staff Provider  Office Location  [facility] Dating   LMP = 8 wk Conard Novak  Language  English Anatomy US  Complete 4/6  Flu Vaccine  declines Genetic Screen  NIPS:      TDaP vaccine    Hgb A1C or  GTT Early : Third trimester :   Rhogam  Not needed   LAB RESULTS   Feeding Plan  Blood Type O/Positive/-- (12/09 1155)   Contraception  Antibody Negative (12/09 1155)  Circumcision  Rubella 3.79 (12/09 1155)  Pediatrician   RPR Non Reactive (12/09 1155)   Support Person  HBsAg Negative (12/09 1155)   Prenatal Classes  HIV Non Reactive (12/09 1155)    Varicella Immune  BTL Consent Desires to do outpatient was [facility]  (For PCN allergy, check sensitivities)        VBAC Consent  Pap  2021 NIL    Hgb Electro   negative  Covid Unvaccinated, ecourgared vaccination CF      SMA         Pregnancy related diagnoses History of thrombocytopenia in pregnancy History of precipitous deliveries Umbilical hernia      Previous Version  Preterm labor symptoms and general obstetric precautions including but not limited to vaginal bleeding, contractions, leaking of fluid and fetal movement were reviewed in detail with the patient. Please refer to After Visit Summary for other counseling recommendations.   - 28 wk labs today (RH+). Recheck plts. Heme consult likely.   Return in about 2 weeks (around 11/05/2020) for Routine Prenatal  Appointment.   Thomasene Mohair, MD, [name], [facility] 10/22/2020 10:26 AM

## 2020-10-23 LAB — 28 WEEK RH+PANEL
Basophils Absolute: 0 10*3/uL (ref 0.0–0.2)
Basos: 1 %
EOS (ABSOLUTE): 0.1 10*3/uL (ref 0.0–0.4)
Eos: 2 %
Gestational Diabetes Screen: 102 mg/dL (ref 65–139)
HIV Screen 4th Generation wRfx: NONREACTIVE
Hematocrit: 35.5 % (ref 34.0–46.6)
Hemoglobin: 11.9 g/dL (ref 11.1–15.9)
Immature Grans (Abs): 0 10*3/uL (ref 0.0–0.1)
Immature Granulocytes: 1 %
Lymphocytes Absolute: 1.1 10*3/uL (ref 0.7–3.1)
Lymphs: 18 %
MCH: 31.6 pg (ref 26.6–33.0)
MCHC: 33.5 g/dL (ref 31.5–35.7)
MCV: 94 fL (ref 79–97)
Monocytes Absolute: 0.3 10*3/uL (ref 0.1–0.9)
Monocytes: 4 %
Neutrophils Absolute: 4.9 10*3/uL (ref 1.4–7.0)
Neutrophils: 74 %
Platelets: 86 10*3/uL — CL (ref 150–450)
RBC: 3.76 x10E6/uL — ABNORMAL LOW (ref 3.77–5.28)
RDW: 12.6 % (ref 11.7–15.4)
RPR Ser Ql: NONREACTIVE
WBC: 6.5 10*3/uL (ref 3.4–10.8)

## 2020-10-29 ENCOUNTER — Other Ambulatory Visit: Payer: Self-pay | Admitting: Obstetrics and Gynecology

## 2020-10-29 DIAGNOSIS — O99119 Other diseases of the blood and blood-forming organs and certain disorders involving the immune mechanism complicating pregnancy, unspecified trimester: Secondary | ICD-10-CM

## 2020-10-29 DIAGNOSIS — D696 Thrombocytopenia, unspecified: Secondary | ICD-10-CM

## 2020-11-05 ENCOUNTER — Encounter: Payer: Self-pay | Admitting: Nurse Practitioner

## 2020-11-05 ENCOUNTER — Inpatient Hospital Stay: Payer: Medicaid Other | Attending: Nurse Practitioner | Admitting: Nurse Practitioner

## 2020-11-05 ENCOUNTER — Inpatient Hospital Stay: Payer: Medicaid Other

## 2020-11-05 ENCOUNTER — Encounter: Payer: Self-pay | Admitting: Advanced Practice Midwife

## 2020-11-05 ENCOUNTER — Ambulatory Visit (INDEPENDENT_AMBULATORY_CARE_PROVIDER_SITE_OTHER): Payer: Medicaid Other | Admitting: Advanced Practice Midwife

## 2020-11-05 ENCOUNTER — Other Ambulatory Visit: Payer: Self-pay

## 2020-11-05 VITALS — BP 107/68 | HR 71 | Temp 97.7°F | Resp 20 | Wt 152.1 lb

## 2020-11-05 VITALS — BP 110/68 | Wt 154.0 lb

## 2020-11-05 DIAGNOSIS — D696 Thrombocytopenia, unspecified: Secondary | ICD-10-CM

## 2020-11-05 DIAGNOSIS — D649 Anemia, unspecified: Secondary | ICD-10-CM | POA: Diagnosis not present

## 2020-11-05 DIAGNOSIS — Z3A30 30 weeks gestation of pregnancy: Secondary | ICD-10-CM | POA: Diagnosis not present

## 2020-11-05 DIAGNOSIS — D509 Iron deficiency anemia, unspecified: Secondary | ICD-10-CM | POA: Diagnosis not present

## 2020-11-05 DIAGNOSIS — Z3A29 29 weeks gestation of pregnancy: Secondary | ICD-10-CM

## 2020-11-05 DIAGNOSIS — Z3483 Encounter for supervision of other normal pregnancy, third trimester: Secondary | ICD-10-CM

## 2020-11-05 DIAGNOSIS — O99113 Other diseases of the blood and blood-forming organs and certain disorders involving the immune mechanism complicating pregnancy, third trimester: Secondary | ICD-10-CM | POA: Diagnosis not present

## 2020-11-05 DIAGNOSIS — O99013 Anemia complicating pregnancy, third trimester: Secondary | ICD-10-CM | POA: Diagnosis present

## 2020-11-05 LAB — CBC WITH DIFFERENTIAL/PLATELET
Abs Immature Granulocytes: 0.06 10*3/uL (ref 0.00–0.07)
Basophils Absolute: 0 10*3/uL (ref 0.0–0.1)
Basophils Relative: 0 %
Eosinophils Absolute: 0.1 10*3/uL (ref 0.0–0.5)
Eosinophils Relative: 2 %
HCT: 35.5 % — ABNORMAL LOW (ref 36.0–46.0)
Hemoglobin: 11.9 g/dL — ABNORMAL LOW (ref 12.0–15.0)
Immature Granulocytes: 1 %
Lymphocytes Relative: 16 %
Lymphs Abs: 1.3 10*3/uL (ref 0.7–4.0)
MCH: 30.9 pg (ref 26.0–34.0)
MCHC: 33.5 g/dL (ref 30.0–36.0)
MCV: 92.2 fL (ref 80.0–100.0)
Monocytes Absolute: 0.4 10*3/uL (ref 0.1–1.0)
Monocytes Relative: 6 %
Neutro Abs: 5.9 10*3/uL (ref 1.7–7.7)
Neutrophils Relative %: 75 %
Platelets: 90 10*3/uL — ABNORMAL LOW (ref 150–400)
RBC: 3.85 MIL/uL — ABNORMAL LOW (ref 3.87–5.11)
RDW: 12.9 % (ref 11.5–15.5)
WBC: 7.8 10*3/uL (ref 4.0–10.5)
nRBC: 0 % (ref 0.0–0.2)

## 2020-11-05 LAB — POCT URINALYSIS DIPSTICK OB
Glucose, UA: NEGATIVE
POC,PROTEIN,UA: NEGATIVE

## 2020-11-05 LAB — COMPREHENSIVE METABOLIC PANEL
ALT: 8 U/L (ref 0–44)
AST: 14 U/L — ABNORMAL LOW (ref 15–41)
Albumin: 3.1 g/dL — ABNORMAL LOW (ref 3.5–5.0)
Alkaline Phosphatase: 50 U/L (ref 38–126)
Anion gap: 6 (ref 5–15)
BUN: 5 mg/dL — ABNORMAL LOW (ref 6–20)
CO2: 23 mmol/L (ref 22–32)
Calcium: 8.8 mg/dL — ABNORMAL LOW (ref 8.9–10.3)
Chloride: 104 mmol/L (ref 98–111)
Creatinine, Ser: 0.52 mg/dL (ref 0.44–1.00)
GFR, Estimated: >60 mL/min (ref >60)
Glucose, Bld: 76 mg/dL (ref 70–99)
Potassium: 3.6 mmol/L (ref 3.5–5.1)
Sodium: 133 mmol/L — ABNORMAL LOW (ref 135–145)
Total Bilirubin: 0.8 mg/dL (ref 0.3–1.2)
Total Protein: 7.1 g/dL (ref 6.5–8.1)

## 2020-11-05 LAB — URINALYSIS, COMPLETE (UACMP) WITH MICROSCOPIC
Bilirubin Urine: NEGATIVE
Glucose, UA: NEGATIVE mg/dL
Hgb urine dipstick: NEGATIVE
Ketones, ur: NEGATIVE mg/dL
Nitrite: NEGATIVE
Protein, ur: NEGATIVE mg/dL
Specific Gravity, Urine: 1.025 (ref 1.005–1.030)
pH: 7 (ref 5.0–8.0)

## 2020-11-05 LAB — IRON AND TIBC
Iron: 48 ug/dL (ref 28–170)
Saturation Ratios: 9 % — ABNORMAL LOW (ref 10.4–31.8)
TIBC: 557 ug/dL — ABNORMAL HIGH (ref 250–450)
UIBC: 509 ug/dL

## 2020-11-05 LAB — FIBRINOGEN: Fibrinogen: 445 mg/dL (ref 210–475)

## 2020-11-05 LAB — FERRITIN: Ferritin: 9 ng/mL — ABNORMAL LOW (ref 11–307)

## 2020-11-05 LAB — PROTIME-INR
INR: 1 (ref 0.8–1.2)
Prothrombin Time: 13.1 seconds (ref 11.4–15.2)

## 2020-11-05 LAB — LACTATE DEHYDROGENASE: LDH: 135 U/L (ref 98–192)

## 2020-11-05 LAB — APTT: aPTT: 32 seconds (ref 24–36)

## 2020-11-05 NOTE — Patient Instructions (Signed)

## 2020-11-05 NOTE — Progress Notes (Signed)
New Consult Note [facility] [address], [place], [place] [address] [phone] (phone) [phone] (fax)  Patient Care Team: Pcp, No as PCP - General   Name of the patient: Carolyn Chavez  [id number]  [birth year]   Date of visit: 11/05/20  Reason for Referral- Thrombocytopenia & Anemia  Referring Provider: Dr. Almon Hercules Ob-Gyn  History of Presenting Illness-patient is a 30 year old African-American female currently [redacted] weeks pregnant with her fifth pregnancy who presents in consultation from Dr. Jean Rosenthal her GYN for thrombocytopenia in pregnancy.  Her prior pregnancies have been reportedly normal without any bleeding complications.  She does note that she has history of thrombocytopenia previously and previously underwent induction at 37 weeks due to platelet count in the 60s.  She has not required platelet transfusions previously.  She is unsure if her platelet count has been checked outside of her pregnancies. No current bleeding, abnormal bruising, or petechial rash.  She has not required epidural anesthesia for her previous deliveries.  She also reports that she has a history of iron deficiency anemia during pregnancy and is previously received IV Venofer.  Today, she reports fatigue. Denies any neurologic complaints. Denies recent fevers or illnesses. Denies any easy bleeding or bruising. Reports good appetite and denies weight loss. Denies chest pain. Denies any nausea, vomiting, constipation, or diarrhea. Denies urinary complaints. Patient offers no further specific complaints today.  Review of systems- Review of Systems  Constitutional: Positive for malaise/fatigue. Negative for chills, fever and weight loss.  HENT: Negative for hearing loss, nosebleeds, sore throat and tinnitus.   Eyes: Negative for blurred vision and double vision.  Respiratory: Negative for cough, hemoptysis, shortness of breath and wheezing.   Cardiovascular: Negative for chest pain,  palpitations and leg swelling.  Gastrointestinal: Negative for abdominal pain, blood in stool, constipation, diarrhea, melena, nausea and vomiting.  Genitourinary: Negative for dysuria and urgency.  Musculoskeletal: Negative for back pain, falls, joint pain and myalgias.  Skin: Negative for itching and rash.  Neurological: Negative for dizziness, tingling, sensory change, loss of consciousness, weakness and headaches.  Endo/Heme/Allergies: Negative for environmental allergies. Does not bruise/bleed easily.  Psychiatric/Behavioral: Negative for depression. The patient is not nervous/anxious and does not have insomnia.      No Known Allergies  Past Medical History:  Diagnosis Date  . Abnormal Pap smear of cervix    age 23  . Anemia affecting pregnancy in second trimester 07/30/2017  . 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 History of Papanicolaou smear of cervix [id number]; 04/01/2011   neg; neg, ct/gc neg;   . Iron deficiency anemia 09/27/2017  . Pica    craves flour  . Postpartum care following vaginal delivery 12/23/2014  . Supervision of high risk pregnancy, antepartum 05/25/2017   Clinic [facility] Prenatal Labs Dating LMP unsure, dating by 9 week [place] Blood type: O/Positive/-- (11/27 1039)  Genetic Screen 1 Screen: Negative, msAFP - declined   Antibody:Negative (11/27 1039) Anatomic [place] IComplete Rubella: 3.55 (11/27 1039) Varicella: Immune GTT Third trimester: 125 RPR: Non Reactive (11/27 1039)  Rhogam n/a HBsAg: Negative (11/27 1039)  TDaP vaccine                        Flu   . Thrombocytopenia (HCC)   . Thrombocytopenia affecting pregnancy, antepartum (HCC) 09/28/2017   Suspected ITP, will need retest of plt count 1-3 months postpartum   . UTI (urinary tract infection)     Past Surgical History:  Procedure Laterality Date  .  COLPOSCOPY      Social History   Socioeconomic History  . Marital status: Married    Spouse name: Not on file  . Number of children: 4  . Years of education: 44  .  Highest education level: Not on file  Occupational History  . Not on file  Tobacco Use  . Smoking status: Former Smoker    Quit date: 03/22/2014    Years since quitting: 6.6  . Smokeless tobacco: Never Used  Vaping Use  . Vaping Use: Never used  Substance and Sexual Activity  . Alcohol use: Not Currently  . Drug use: No  . Sexual activity: Yes    Birth control/protection: Other-see comments  Other Topics Concern  . Not on file  Social History Narrative  . Not on file   Social Determinants of Health   Financial Resource Strain: Not on file  Food Insecurity: Not on file  Transportation Needs: Not on file  Physical Activity: Not on file  Stress: Not on file  Social Connections: Not on file  Intimate Partner Violence: Not At Risk  . Fear of Current or Ex-Partner: No  . Emotionally Abused: No  . Physically Abused: No  . Sexually Abused: No    Family History  Problem Relation Age of Onset  . Cancer Maternal Grandfather 60       lung, brain  . Hypertension Maternal Grandfather   . Skin cancer Maternal Grandfather   . Schizophrenia Brother   . Hearing loss Daughter        2/16; oldest daughter; low freq loss     Current Outpatient Medications:  .  Prenatal Vit-Fe Fumarate-FA (PRENATAL MULTIVITAMIN) TABS tablet, Take 1 tablet by mouth daily at 12 noon. , Disp: , Rfl:   Physical exam:  Vitals:   11/05/20 1415  BP: 107/68  Pulse: 71  Resp: 20  Temp: 97.7 F (36.5 C)  SpO2: 100%  Weight: 152 lb 1.9 oz (69 kg)   Physical Exam Constitutional:      Appearance: She is well-developed. She is not ill-appearing.  HENT:     Head: Atraumatic.     Nose: Nose normal.     Mouth/Throat:     Pharynx: No oropharyngeal exudate.  Eyes:     General: No scleral icterus.    Conjunctiva/sclera: Conjunctivae normal.  Cardiovascular:     Rate and Rhythm: Normal rate and regular rhythm.  Pulmonary:     Effort: Pulmonary effort is normal.     Breath sounds: Normal breath sounds.   Abdominal:     Comments: gravid  Musculoskeletal:        General: Normal range of motion.     Cervical back: Neck supple.  Lymphadenopathy:     Cervical: No cervical adenopathy.  Skin:    General: Skin is warm and dry.     Coloration: Skin is not pale.     Findings: No bruising or rash.  Neurological:     General: No focal deficit present.     Mental Status: She is alert and oriented to person, place, and time.  Psychiatric:        Mood and Affect: Mood normal.        Behavior: Behavior normal.      CMP Latest Ref Rng & Units 11/05/2020  Glucose 70 - 99 mg/dL 76  BUN 6 - 20 mg/dL 5(L)  Creatinine 7.42 - 1.00 mg/dL 5.95  Sodium 638 - 756 mmol/L 133(L)  Potassium 3.5 - 5.1 mmol/L  3.6  Chloride 98 - 111 mmol/L 104  CO2 22 - 32 mmol/L 23  Calcium 8.9 - 10.3 mg/dL 0.2(I)  Total Protein 6.5 - 8.1 g/dL 7.1  Total Bilirubin 0.3 - 1.2 mg/dL 0.8  Alkaline Phos 38 - 126 U/L 50  AST 15 - 41 U/L 14(L)  ALT 0 - 44 U/L 8   CBC Latest Ref Rng & Units 11/05/2020  WBC 4.0 - 10.5 K/uL 7.8  Hemoglobin 12.0 - 15.0 g/dL 11.9(L)  Hematocrit 36.0 - 46.0 % 35.5(L)  Platelets 150 - 400 K/uL 90(L)    No images are attached to the encounter.  No results found.  Assessment and plan- Patient is a 30 y.o. female with thrombocytopenia in pregnancy.  History of thrombocytopenia during pregnancy with previous pregnancies with platelet counts in 60s and 70s.  Previous work-up with Dr. Smith Robert. Differentials: possible ITP vs gestational thrombocytopenia most likely. Platelet counts outside of pregnancy are limited. Most recent platelet count in 80s. No bleeding, fevers, chills, hypertension or hypotension, melena, hematochezia. No history of bleeding disorders. No history of eclampsia or autoimmune disorders.   Thrombocytopenia- labs today: cbc with differential, cmp, smear review, B12, folate, LDH, aptt, pt/inr, fibrinogen, UA to evaluate for protein.  No treatment for thrombocytopenia is needed as long as  platelet counts remain greater than 50,000.  Typically, thrombocytopenia is not treated unless platelet count is less than 30,000 and ITP.  Will monitor her blood counts closely particularly in her third trimester pregnancy and consider treatment with steroids and IVIG if her platelet counts drop below 50,000.  Normocytic anemia-labs today including ferritin, iron studies, B12, folate. Hx of iron deficiency. Will add venofer treatment plan to get authorized by insurance.   Plan to RTC in 1 week to discuss results of labs with Dr. Smith Robert, possible venofer.   Visit Diagnosis 1. Thrombocytopenia (HCC)   2. Normocytic anemia    Patient expressed understanding and was in agreement with this plan. She also understands that She can call clinic at any time with any questions, concerns, or complaints.   Thank you for allowing me to participate in the care of this very pleasant patient.   Consuello Masse, DNP, AGNP-[facility] [phone]  CC: Dr. Smith Robert

## 2020-11-05 NOTE — Progress Notes (Signed)
Routine Prenatal Care Visit  Subjective  Carolyn Chavez is a 30 y.o. G6P5005 at [redacted]w[redacted]d being seen today for ongoing prenatal care.  She is currently monitored for the following issues for this low-risk pregnancy and has Iron deficiency anemia; Umbilical hernia without obstruction and without gangrene; History of thrombocytopenia; Supervision of other normal pregnancy, antepartum; and Thrombocytopenia affecting pregnancy, antepartum (HCC) on their problem list.  ----------------------------------------------------------------------------------- Patient reports fatigue. She is seeing hematology later today regarding thrombocytopenia.  She is taking pnv with fe.  Contractions: Not present. Vag. Bleeding: None.  Movement: Present. Leaking Fluid denies.  ----------------------------------------------------------------------------------- The following portions of the patient's history were reviewed and updated as appropriate: allergies, current medications, past family history, past medical history, past social history, past surgical history and problem list. Problem list updated.  Objective  Blood pressure 110/68, weight 154 lb (69.9 kg), last menstrual period 04/11/2020, unknown if currently breastfeeding. Pregravid weight 130 lb (59 kg) Total Weight Gain 24 lb (10.9 kg) Urinalysis: Urine Protein Negative  Urine Glucose Negative  Fetal Status: Fetal Heart Rate (bpm): 142 Fundal Height: 29 cm Movement: Present     General:  Alert, oriented and cooperative. Patient is in no acute distress.  Skin: Skin is warm and dry. No rash noted.   Cardiovascular: Normal heart rate noted  Respiratory: Normal respiratory effort, no problems with respiration noted  Abdomen: Soft, gravid, appropriate for gestational age. Pain/Pressure: Absent     Pelvic:  Cervical exam deferred        Extremities: Normal range of motion.  Edema: None  Mental Status: Normal mood and affect. Normal behavior. Normal judgment and  thought content.   Assessment   30 y.o. [id number] at [redacted]w[redacted]d by  01/16/2021, by Last Menstrual Period presenting for routine prenatal visit  Plan   pregnancy6 Problems (from 04/11/20 to present)    Problem Noted Resolved   Thrombocytopenia affecting pregnancy, antepartum (HCC) 10/22/2020 by Conard Novak, MD No   History of thrombocytopenia 06/06/2020 by Zipporah Plants, CNM No   Overview Signed 10/02/2020  3:43 PM by Conard Novak, MD    - plts 111 at 20 wk cbc [ ]  recheck @ 28 wks      Supervision of other normal pregnancy, antepartum 06/06/2020 by 14/02/2020, CNM No   Overview Addendum 10/02/2020  3:42 PM by 12/02/2020, MD     Nursing Staff Provider  Office Location  [facility] Dating   LMP = 8 wk Conard Novak  Language  English Anatomy US  Complete 4/6  Flu Vaccine  declines Genetic Screen  NIPS:      TDaP vaccine    Hgb A1C or  GTT Early : Third trimester :   Rhogam  Not needed   LAB RESULTS   Feeding Plan  Blood Type O/Positive/-- (12/09 1155)   Contraception  Antibody Negative (12/09 1155)  Circumcision  Rubella 3.79 (12/09 1155)  Pediatrician   RPR Non Reactive (12/09 1155)   Support Person  HBsAg Negative (12/09 1155)   Prenatal Classes  HIV Non Reactive (12/09 1155)    Varicella Immune  BTL Consent Desires to do outpatient was [facility]  (For PCN allergy, check sensitivities)        VBAC Consent  Pap  2021 NIL    Hgb Electro   negative  Covid Unvaccinated, ecourgared vaccination CF      SMA         Pregnancy related diagnoses History of thrombocytopenia in pregnancy History of precipitous  deliveries Umbilical hernia      Previous Version       Preterm labor symptoms and general obstetric precautions including but not limited to vaginal bleeding, contractions, leaking of fluid and fetal movement were reviewed in detail with the patient. Please refer to After Visit Summary for other counseling recommendations.   Return in about 2 weeks (around 11/19/2020) for  rob.  Tresea Mall, CNM 11/05/2020 10:57 AM

## 2020-11-06 LAB — PATHOLOGIST SMEAR REVIEW

## 2020-11-13 ENCOUNTER — Other Ambulatory Visit: Payer: Self-pay

## 2020-11-13 ENCOUNTER — Encounter: Payer: Medicaid Other | Admitting: Obstetrics and Gynecology

## 2020-11-13 ENCOUNTER — Inpatient Hospital Stay (HOSPITAL_BASED_OUTPATIENT_CLINIC_OR_DEPARTMENT_OTHER): Payer: Medicaid Other | Admitting: Oncology

## 2020-11-13 ENCOUNTER — Inpatient Hospital Stay: Payer: Medicaid Other

## 2020-11-13 VITALS — BP 123/77 | HR 89

## 2020-11-13 VITALS — BP 107/68 | HR 77 | Temp 98.7°F | Wt 153.0 lb

## 2020-11-13 DIAGNOSIS — D696 Thrombocytopenia, unspecified: Secondary | ICD-10-CM | POA: Diagnosis not present

## 2020-11-13 DIAGNOSIS — D509 Iron deficiency anemia, unspecified: Secondary | ICD-10-CM

## 2020-11-13 MED ORDER — SODIUM CHLORIDE 0.9 % IV SOLN
Freq: Once | INTRAVENOUS | Status: AC
Start: 2020-11-13 — End: 2020-11-13
  Filled 2020-11-13: qty 250

## 2020-11-13 MED ORDER — SODIUM CHLORIDE 0.9 % IV SOLN: 200.0000 mg | Freq: Once | INTRAVENOUS | Status: DC

## 2020-11-13 MED ORDER — IRON SUCROSE 20 MG/ML IV SOLN
200.0000 mg | Freq: Once | INTRAVENOUS | Status: AC
  Administered 2020-11-13: 200 mg via INTRAVENOUS
  Filled 2020-11-13: qty 10

## 2020-11-14 ENCOUNTER — Encounter: Payer: Self-pay | Admitting: Oncology

## 2020-11-14 NOTE — Progress Notes (Signed)
Hematology/Oncology Consult note [facility]  [phone] [phone]  Patient Care Team: Pcp, No as PCP - General   Name of the patient: Carolyn Chavez  [id number]  [birth year]   Date of visit: 11/14/20  Diagnosis-iron deficiency anemia in third trimester of pregnancy Thrombocytopenia during pregnancy likely gestational  Chief complaint/ Reason for visit-discussed results of blood work  Heme/Onc history: Patient is a 30 year old female and this is her sixth pregnancy and she is currently at 31 weeks of gestation.  She was previously seen by me in her fifth pregnancy as well for thrombocytopenia.  We did not have any platelet counts outside of her pregnancy.  Thrombocytopenia was observed in her fourth and fifth pregnancy in the past.  She did not require any intervention for this including steroids.  Her pregnancy so far have been uneventful with normal vaginal delivery.  No excessive bleeding following delivery  Patient did require IV iron during her fifth pregnancy   Interval history-patient reports that her pregnancy is coming along well.  She denies any specific complaints other than mild fatigue  ECOG PS- 0 Pain scale- 0   Review of systems- Review of Systems  Constitutional: Positive for malaise/fatigue. Negative for chills, fever and weight loss.  HENT: Negative for congestion, ear discharge and nosebleeds.   Eyes: Negative for blurred vision.  Respiratory: Negative for cough, hemoptysis, sputum production, shortness of breath and wheezing.   Cardiovascular: Negative for chest pain, palpitations, orthopnea and claudication.  Gastrointestinal: Negative for abdominal pain, blood in stool, constipation, diarrhea, heartburn, melena, nausea and vomiting.  Genitourinary: Negative for dysuria, flank pain, frequency, hematuria and urgency.  Musculoskeletal: Negative for back pain, joint pain and myalgias.  Skin: Negative for rash.   Neurological: Negative for dizziness, tingling, focal weakness, seizures, weakness and headaches.  Endo/Heme/Allergies: Does not bruise/bleed easily.  Psychiatric/Behavioral: Negative for depression and suicidal ideas. The patient does not have insomnia.       No Known Allergies   Past Medical History:  Diagnosis Date  . Abnormal Pap smear of cervix    age 27  . Anemia affecting pregnancy in second trimester 07/30/2017  . 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 History of Papanicolaou smear of cervix [id number]; 04/01/2011   neg; neg, ct/gc neg;   . Iron deficiency anemia 09/27/2017  . Pica    craves flour  . Postpartum care following vaginal delivery 12/23/2014  . Supervision of high risk pregnancy, antepartum 05/25/2017   Clinic [facility] Prenatal Labs Dating LMP unsure, dating by 9 week [place] Blood type: O/Positive/-- (11/27 1039)  Genetic Screen 1 Screen: Negative, msAFP - declined   Antibody:Negative (11/27 1039) Anatomic [place] IComplete Rubella: 3.55 (11/27 1039) Varicella: Immune GTT Third trimester: 125 RPR: Non Reactive (11/27 1039)  Rhogam n/a HBsAg: Negative (11/27 1039)  TDaP vaccine                        Flu   . Thrombocytopenia (HCC)   . Thrombocytopenia affecting pregnancy, antepartum (HCC) 09/28/2017   Suspected ITP, will need retest of plt count 1-3 months postpartum   . UTI (urinary tract infection)      Past Surgical History:  Procedure Laterality Date  . COLPOSCOPY      Social History   Socioeconomic History  . Marital status: Married    Spouse name: Not on file  . Number of children: 4  . Years of education: 74  . Highest education level:  Not on file  Occupational History  . Not on file  Tobacco Use  . Smoking status: Former Smoker    Quit date: 03/22/2014    Years since quitting: 6.6  . Smokeless tobacco: Never Used  Vaping Use  . Vaping Use: Never used  Substance and Sexual Activity  . Alcohol use: Not Currently  . Drug use: No  . Sexual activity: Yes    Birth  control/protection: Other-see comments  Other Topics Concern  . Not on file  Social History Narrative  . Not on file   Social Determinants of Health   Financial Resource Strain: Not on file  Food Insecurity: Not on file  Transportation Needs: Not on file  Physical Activity: Not on file  Stress: Not on file  Social Connections: Not on file  Intimate Partner Violence: Not At Risk  . Fear of Current or Ex-Partner: No  . Emotionally Abused: No  . Physically Abused: No  . Sexually Abused: No    Family History  Problem Relation Age of Onset  . Cancer Maternal Grandfather 60       lung, brain  . Hypertension Maternal Grandfather   . Skin cancer Maternal Grandfather   . Schizophrenia Brother   . Hearing loss Daughter        2/16; oldest daughter; low freq loss     Current Outpatient Medications:  .  Prenatal Vit-Fe Fumarate-FA (PRENATAL MULTIVITAMIN) TABS tablet, Take 1 tablet by mouth daily at 12 noon. , Disp: , Rfl:   Physical exam:  Vitals:   11/13/20 1427  BP: 107/68  Pulse: 77  Temp: 98.7 F (37.1 C)  TempSrc: Tympanic  SpO2: 99%  Weight: 153 lb (69.4 kg)   Physical Exam Constitutional:      General: She is not in acute distress. Cardiovascular:     Rate and Rhythm: Normal rate and regular rhythm.     Heart sounds: Normal heart sounds.  Pulmonary:     Effort: Pulmonary effort is normal.     Breath sounds: Normal breath sounds.  Abdominal:     Comments: Gravid uterus  Musculoskeletal:     Right lower leg: No edema.     Left lower leg: No edema.  Skin:    General: Skin is warm and dry.  Neurological:     Mental Status: She is alert and oriented to person, place, and time.      CMP Latest Ref Rng & Units 11/05/2020  Glucose 70 - 99 mg/dL 76  BUN 6 - 20 mg/dL 5(L)  Creatinine 0.93 - 1.00 mg/dL 2.35  Sodium 573 - 220 mmol/L 133(L)  Potassium 3.5 - 5.1 mmol/L 3.6  Chloride 98 - 111 mmol/L 104  CO2 22 - 32 mmol/L 23  Calcium 8.9 - 10.3 mg/dL 2.5(K)   Total Protein 6.5 - 8.1 g/dL 7.1  Total Bilirubin 0.3 - 1.2 mg/dL 0.8  Alkaline Phos 38 - 126 U/L 50  AST 15 - 41 U/L 14(L)  ALT 0 - 44 U/L 8   CBC Latest Ref Rng & Units 11/05/2020  WBC 4.0 - 10.5 K/uL 7.8  Hemoglobin 12.0 - 15.0 g/dL 11.9(L)  Hematocrit 36.0 - 46.0 % 35.5(L)  Platelets 150 - 400 K/uL 90(L)      Assessment and plan- Patient is a 30 y.o. female currently at 25 weeks of gestation and this is her sixth pregnancy and she is here for following issues:  1.  Thrombocytopenia: Likely gestational thrombocytopeniaPT PTT INR fibrinogen were within normal  limits.  Smear review was unremarkable and LDH was normal.  We do not have any platelet counts outside of her pregnancy and she has not had any issues with thrombocytopenia with her previous pregnancy.  As long as platelet counts remain more than 50 she does not require any intervention.  Recent platelet count was 90.  Plan to check platelet counts every 2 weeks at this point and I will see her back in 6 weeks  2.  Iron deficiency anemia: Patient has mild anemia with a hemoglobin of 11.9And she was found to have low ferritin level of 9 with an elevated TIBC of 557 suggestive of iron deficiency.  Anemia in pregnancy can cause complications such as postpartum bleeding and given her ongoing thrombocytopenia as well I would like to improve her iron stores in anticipation of delivery.  I therefore recommend 5 doses of Venofer for given over the next 2 to 3 weeks with the first dose being given today.  Repeat CBC ferritin and iron studies B12 and folate in 6 weeks.  Discussed risks and benefits of Venofer including all but not limited to headache, nausea and possible risk of infusion reaction.  Patient understands and agrees to proceed as planned   Visit Diagnosis 1. Thrombocytopenia (HCC)   2. Iron deficiency anemia, unspecified iron deficiency anemia type      Dr. Owens Shark, MD, MPH [facility] [phone] 11/14/2020 9:18 AM

## 2020-11-19 ENCOUNTER — Other Ambulatory Visit: Payer: Self-pay

## 2020-11-19 ENCOUNTER — Ambulatory Visit (INDEPENDENT_AMBULATORY_CARE_PROVIDER_SITE_OTHER): Payer: Medicaid Other | Admitting: Advanced Practice Midwife

## 2020-11-19 ENCOUNTER — Encounter: Payer: Self-pay | Admitting: Advanced Practice Midwife

## 2020-11-19 VITALS — BP 110/60 | Wt 157.0 lb

## 2020-11-19 DIAGNOSIS — Z3480 Encounter for supervision of other normal pregnancy, unspecified trimester: Secondary | ICD-10-CM

## 2020-11-19 DIAGNOSIS — Z3A31 31 weeks gestation of pregnancy: Secondary | ICD-10-CM

## 2020-11-19 LAB — POCT URINALYSIS DIPSTICK OB
Glucose, UA: NEGATIVE
POC,PROTEIN,UA: NEGATIVE

## 2020-11-19 NOTE — Addendum Note (Signed)
Addended by: Cornelius Moras D on: 11/19/2020 11:07 AM   Modules accepted: Orders

## 2020-11-19 NOTE — Progress Notes (Signed)
Routine Prenatal Care Visit  Subjective  Carolyn Chavez is a 30 y.o. G6P5005 at [redacted]w[redacted]d being seen today for ongoing prenatal care.  She is currently monitored for the following issues for this low-risk pregnancy and has Iron deficiency anemia; Umbilical hernia without obstruction and without gangrene; History of thrombocytopenia; Supervision of other normal pregnancy, antepartum; and Thrombocytopenia affecting pregnancy, antepartum (HCC) on their problem list.  ----------------------------------------------------------------------------------- Patient reports no complaints. She has questions regarding blood transfusion consent and wonders if she can use a family member's blood or self transfusion. She will ask hematology at tomorrow's Fe infusion appointment.   Contractions: Not present. Vag. Bleeding: None.  Movement: Present. Leaking Fluid denies.  ----------------------------------------------------------------------------------- The following portions of the patient's history were reviewed and updated as appropriate: allergies, current medications, past family history, past medical history, past social history, past surgical history and problem list. Problem list updated.  Objective  Blood pressure 110/60, weight 157 lb (71.2 kg), last menstrual period 04/11/2020 Pregravid weight 130 lb (59 kg) Total Weight Gain 27 lb (12.2 kg) Urinalysis: Urine Protein    Urine Glucose    Fetal Status: Fetal Heart Rate (bpm): 149 Fundal Height: 32 cm Movement: Present     General:  Alert, oriented and cooperative. Patient is in no acute distress.  Skin: Skin is warm and dry. No rash noted.   Cardiovascular: Normal heart rate noted  Respiratory: Normal respiratory effort, no problems with respiration noted  Abdomen: Soft, gravid, appropriate for gestational age. Pain/Pressure: Present     Pelvic:  Cervical exam deferred        Extremities: Normal range of motion.  Edema: None  Mental Status: Normal  mood and affect. Normal behavior. Normal judgment and thought content.   Assessment   30 y.o. [id number] at [redacted]w[redacted]d by  01/16/2021, by Last Menstrual Period presenting for routine prenatal visit  Plan   pregnancy6 Problems (from 04/11/20 to present)    Problem Noted Resolved   Thrombocytopenia affecting pregnancy, antepartum (HCC) 10/22/2020 by Conard Novak, MD No   History of thrombocytopenia 06/06/2020 by Zipporah Plants, CNM No   Overview Signed 10/02/2020  3:43 PM by Conard Novak, MD    - plts 111 at 20 wk cbc [ ]  recheck @ 28 wks      Supervision of other normal pregnancy, antepartum 06/06/2020 by 14/02/2020, CNM No   Overview Addendum 10/02/2020  3:42 PM by 12/02/2020, MD     Nursing Staff Provider  Office Location  [facility] Dating   LMP = 8 wk Conard Novak  Language  English Anatomy US  Complete 4/6  Flu Vaccine  declines Genetic Screen  NIPS:      TDaP vaccine    Hgb A1C or  GTT Early : Third trimester :   Rhogam  Not needed   LAB RESULTS   Feeding Plan  Blood Type O/Positive/-- (12/09 1155)   Contraception  Antibody Negative (12/09 1155)  Circumcision  Rubella 3.79 (12/09 1155)  Pediatrician   RPR Non Reactive (12/09 1155)   Support Person  HBsAg Negative (12/09 1155)   Prenatal Classes  HIV Non Reactive (12/09 1155)    Varicella Immune  BTL Consent Desires to do outpatient was [facility]  (For PCN allergy, check sensitivities)        VBAC Consent  Pap  2021 NIL    Hgb Electro   negative  Covid Unvaccinated, ecourgared vaccination CF      SMA  Pregnancy related diagnoses History of thrombocytopenia in pregnancy History of precipitous deliveries Umbilical hernia      Previous Version       Preterm labor symptoms and general obstetric precautions including but not limited to vaginal bleeding, contractions, leaking of fluid and fetal movement were reviewed in detail with the patient.   Return in about 2 weeks (around 12/03/2020) for rob.  Tresea Mall, CNM 11/19/2020 10:55 AM

## 2020-11-20 ENCOUNTER — Inpatient Hospital Stay: Payer: Medicaid Other

## 2020-11-20 VITALS — BP 123/70 | HR 88 | Temp 97.0°F | Resp 18

## 2020-11-20 DIAGNOSIS — D509 Iron deficiency anemia, unspecified: Secondary | ICD-10-CM

## 2020-11-20 MED ORDER — IRON SUCROSE 20 MG/ML IV SOLN
200.0000 mg | Freq: Once | INTRAVENOUS | Status: AC
  Administered 2020-11-20: 200 mg via INTRAVENOUS
  Filled 2020-11-20: qty 10

## 2020-11-20 MED ORDER — SODIUM CHLORIDE 0.9 % IV SOLN: 200.0000 mg | Freq: Once | INTRAVENOUS | Status: DC

## 2020-11-20 MED ORDER — SODIUM CHLORIDE 0.9 % IV SOLN
Freq: Once | INTRAVENOUS | Status: AC
  Filled 2020-11-20: qty 250

## 2020-11-20 NOTE — Patient Instructions (Signed)

## 2020-11-22 ENCOUNTER — Inpatient Hospital Stay: Payer: Medicaid Other

## 2020-11-22 VITALS — BP 104/69 | HR 79 | Temp 97.7°F | Resp 18

## 2020-11-22 DIAGNOSIS — D509 Iron deficiency anemia, unspecified: Secondary | ICD-10-CM

## 2020-11-22 MED ORDER — SODIUM CHLORIDE 0.9 % IV SOLN
Freq: Once | INTRAVENOUS | Status: AC
  Filled 2020-11-22: qty 250

## 2020-11-22 MED ORDER — IRON SUCROSE 20 MG/ML IV SOLN
200.0000 mg | Freq: Once | INTRAVENOUS | Status: AC
  Administered 2020-11-22: 200 mg via INTRAVENOUS
  Filled 2020-11-22: qty 10

## 2020-11-22 MED ORDER — SODIUM CHLORIDE 0.9 % IV SOLN: 200.0000 mg | Freq: Once | INTRAVENOUS | Status: DC

## 2020-11-26 ENCOUNTER — Inpatient Hospital Stay: Payer: Medicaid Other

## 2020-11-26 ENCOUNTER — Other Ambulatory Visit: Payer: Self-pay

## 2020-11-26 VITALS — BP 111/70 | HR 86 | Temp 97.8°F | Resp 18

## 2020-11-26 DIAGNOSIS — D509 Iron deficiency anemia, unspecified: Secondary | ICD-10-CM | POA: Diagnosis not present

## 2020-11-26 DIAGNOSIS — D696 Thrombocytopenia, unspecified: Secondary | ICD-10-CM

## 2020-11-26 LAB — CBC WITH DIFFERENTIAL/PLATELET
Abs Immature Granulocytes: 0.1 10*3/uL — ABNORMAL HIGH (ref 0.00–0.07)
Basophils Absolute: 0 10*3/uL (ref 0.0–0.1)
Basophils Relative: 0 %
Eosinophils Absolute: 0.1 10*3/uL (ref 0.0–0.5)
Eosinophils Relative: 2 %
HCT: 34.2 % — ABNORMAL LOW (ref 36.0–46.0)
Hemoglobin: 11.5 g/dL — ABNORMAL LOW (ref 12.0–15.0)
Immature Granulocytes: 1 %
Lymphocytes Relative: 19 %
Lymphs Abs: 1.3 10*3/uL (ref 0.7–4.0)
MCH: 31.1 pg (ref 26.0–34.0)
MCHC: 33.6 g/dL (ref 30.0–36.0)
MCV: 92.4 fL (ref 80.0–100.0)
Monocytes Absolute: 0.4 10*3/uL (ref 0.1–1.0)
Monocytes Relative: 6 %
Neutro Abs: 5 10*3/uL (ref 1.7–7.7)
Neutrophils Relative %: 72 %
Platelets: 78 10*3/uL — ABNORMAL LOW (ref 150–400)
RBC: 3.7 MIL/uL — ABNORMAL LOW (ref 3.87–5.11)
RDW: 14.1 % (ref 11.5–15.5)
WBC: 7 10*3/uL (ref 4.0–10.5)
nRBC: 0 % (ref 0.0–0.2)

## 2020-11-26 LAB — VITAMIN B12: Vitamin B-12: 140 pg/mL — ABNORMAL LOW (ref 180–914)

## 2020-11-26 LAB — FOLATE: Folate: 15.3 ng/mL (ref >5.9)

## 2020-11-26 MED ORDER — SODIUM CHLORIDE 0.9 % IV SOLN
Freq: Once | INTRAVENOUS | Status: AC
  Filled 2020-11-26: qty 250

## 2020-11-26 MED ORDER — IRON SUCROSE 20 MG/ML IV SOLN
200.0000 mg | Freq: Once | INTRAVENOUS | Status: AC
  Administered 2020-11-26: 200 mg via INTRAVENOUS
  Filled 2020-11-26: qty 10

## 2020-11-26 MED ORDER — SODIUM CHLORIDE 0.9 % IV SOLN: 200.0000 mg | Freq: Once | INTRAVENOUS | Status: DC

## 2020-11-28 ENCOUNTER — Other Ambulatory Visit: Payer: Self-pay

## 2020-11-28 ENCOUNTER — Inpatient Hospital Stay: Payer: Medicaid Other | Attending: Oncology

## 2020-11-28 ENCOUNTER — Telehealth: Payer: Self-pay | Admitting: *Deleted

## 2020-11-28 VITALS — BP 104/65 | HR 79 | Temp 97.6°F | Resp 18

## 2020-11-28 DIAGNOSIS — E538 Deficiency of other specified B group vitamins: Secondary | ICD-10-CM | POA: Insufficient documentation

## 2020-11-28 DIAGNOSIS — D696 Thrombocytopenia, unspecified: Secondary | ICD-10-CM | POA: Diagnosis present

## 2020-11-28 DIAGNOSIS — Z79899 Other long term (current) drug therapy: Secondary | ICD-10-CM | POA: Diagnosis not present

## 2020-11-28 DIAGNOSIS — O99013 Anemia complicating pregnancy, third trimester: Secondary | ICD-10-CM | POA: Diagnosis not present

## 2020-11-28 DIAGNOSIS — Z87891 Personal history of nicotine dependence: Secondary | ICD-10-CM | POA: Insufficient documentation

## 2020-11-28 DIAGNOSIS — D509 Iron deficiency anemia, unspecified: Secondary | ICD-10-CM | POA: Insufficient documentation

## 2020-11-28 DIAGNOSIS — D693 Immune thrombocytopenic purpura: Secondary | ICD-10-CM | POA: Diagnosis not present

## 2020-11-28 DIAGNOSIS — O99113 Other diseases of the blood and blood-forming organs and certain disorders involving the immune mechanism complicating pregnancy, third trimester: Secondary | ICD-10-CM | POA: Insufficient documentation

## 2020-11-28 MED ORDER — SODIUM CHLORIDE 0.9 % IV SOLN: 200.0000 mg | Freq: Once | INTRAVENOUS | Status: DC

## 2020-11-28 MED ORDER — SODIUM CHLORIDE 0.9 % IV SOLN
Freq: Once | INTRAVENOUS | Status: AC
  Filled 2020-11-28: qty 250

## 2020-11-28 MED ORDER — IRON SUCROSE 20 MG/ML IV SOLN
200.0000 mg | Freq: Once | INTRAVENOUS | Status: AC
  Administered 2020-11-28: 200 mg via INTRAVENOUS

## 2020-11-28 NOTE — Telephone Encounter (Signed)
I called the pt's cell phone and she did not answer. I left message going over the hgb 11.6 which is lower than last time but the venofer she is getting takes 2-4 weeks to make a difference in the labs. Her plt - was 78 and if she stays over 50 we do not need to do anything but monitor it.  I looked at her b12 level and she needs to start b12 1000 mcg over the counter . Take 1 daily. Left message that she can call me at [phone] for any questions.

## 2020-11-28 NOTE — Progress Notes (Signed)
Patient tolerated Venofer infusion well, no concerns voiced. Patient discharged. Stable. 

## 2020-12-05 ENCOUNTER — Ambulatory Visit (INDEPENDENT_AMBULATORY_CARE_PROVIDER_SITE_OTHER): Payer: Medicaid Other | Admitting: Obstetrics and Gynecology

## 2020-12-05 ENCOUNTER — Encounter: Payer: Self-pay | Admitting: Obstetrics and Gynecology

## 2020-12-05 ENCOUNTER — Other Ambulatory Visit: Payer: Self-pay

## 2020-12-05 VITALS — BP 108/70 | Ht 66.0 in | Wt 155.2 lb

## 2020-12-05 DIAGNOSIS — Z3A34 34 weeks gestation of pregnancy: Secondary | ICD-10-CM

## 2020-12-05 DIAGNOSIS — D696 Thrombocytopenia, unspecified: Secondary | ICD-10-CM

## 2020-12-05 DIAGNOSIS — O99119 Other diseases of the blood and blood-forming organs and certain disorders involving the immune mechanism complicating pregnancy, unspecified trimester: Secondary | ICD-10-CM

## 2020-12-05 DIAGNOSIS — D508 Other iron deficiency anemias: Secondary | ICD-10-CM

## 2020-12-05 DIAGNOSIS — Z3483 Encounter for supervision of other normal pregnancy, third trimester: Secondary | ICD-10-CM

## 2020-12-05 LAB — POCT URINALYSIS DIPSTICK OB
Glucose, UA: NEGATIVE
POC,PROTEIN,UA: NEGATIVE

## 2020-12-05 NOTE — Progress Notes (Signed)
Routine Prenatal Care Visit  Subjective  Carolyn Chavez is a 30 y.o. G6P5005 at [redacted]w[redacted]d being seen today for ongoing prenatal care.  She is currently monitored for the following issues for this high-risk pregnancy and has Iron deficiency anemia; Umbilical hernia without obstruction and without gangrene; History of thrombocytopenia; Supervision of other normal pregnancy, antepartum; and Thrombocytopenia affecting pregnancy, antepartum (HCC) on their problem list.  ----------------------------------------------------------------------------------- Patient reports no complaints.   Contractions: Not present. Vag. Bleeding: None.  Movement: Present. Leaking Fluid denies.  ----------------------------------------------------------------------------------- The following portions of the patient's history were reviewed and updated as appropriate: allergies, current medications, past family history, past medical history, past social history, past surgical history and problem list. Problem list updated.  Objective  Blood pressure 108/70, height 5\' 6"  (1.676 m), weight 155 lb 3.2 oz (70.4 kg), last menstrual period 04/11/2020, unknown if currently breastfeeding. Pregravid weight 130 lb (59 kg) Total Weight Gain 25 lb 3.2 oz (11.4 kg) Urinalysis: Urine Protein Negative  Urine Glucose Negative  Fetal Status: Fetal Heart Rate (bpm): 135 Fundal Height: 33 cm Movement: Present     General:  Alert, oriented and cooperative. Patient is in no acute distress.  Skin: Skin is warm and dry. No rash noted.   Cardiovascular: Normal heart rate noted  Respiratory: Normal respiratory effort, no problems with respiration noted  Abdomen: Soft, gravid, appropriate for gestational age. Pain/Pressure: Absent     Pelvic:  Cervical exam deferred        Extremities: Normal range of motion.  Edema: None  Mental Status: Normal mood and affect. Normal behavior. Normal judgment and thought content.   Assessment   30 y.o.  30 at [redacted]w[redacted]d by  01/16/2021, by Last Menstrual Period presenting for routine prenatal visit  Plan   pregnancy6 Problems (from 04/11/20 to present)     Problem Noted Resolved   Thrombocytopenia affecting pregnancy, antepartum (HCC) 10/22/2020 by 10/24/2020, MD No   History of thrombocytopenia 06/06/2020 by 14/02/2020, CNM No   Overview Signed 10/02/2020  3:43 PM by 12/02/2020, MD    - plts 111 at 20 wk cbc [ ]  recheck @ 28 wks       Supervision of other normal pregnancy, antepartum 06/06/2020 by , CNM No   Overview Addendum 10/02/2020  3:42 PM by Zipporah Plants, MD     Nursing Staff Provider  Office Location  [facility] Dating   LMP = 8 wk 12/02/2020  Language  English Anatomy Conard Novak  Complete 4/6  Flu Vaccine  declines Genetic Screen  NIPS:      TDaP vaccine    Hgb A1C or  GTT Early : Third trimester :   Rhogam  Not needed   LAB RESULTS   Feeding Plan  Blood Type O/Positive/-- (12/09 1155)   Contraception  Antibody Negative (12/09 1155)  Circumcision  Rubella 3.79 (12/09 1155)  Pediatrician   RPR Non Reactive (12/09 1155)   Support Person  HBsAg Negative (12/09 1155)   Prenatal Classes  HIV Non Reactive (12/09 1155)    Varicella Immune  BTL Consent Desires to do outpatient was [facility]  (For PCN allergy, check sensitivities)        VBAC Consent  Pap  2021 NIL    Hgb Electro   negative  Covid Unvaccinated, ecourgared vaccination CF      SMA        Pregnancy related diagnoses History of thrombocytopenia in pregnancy History of precipitous deliveries Umbilical hernia  Preterm labor symptoms and general obstetric precautions including but not limited to vaginal bleeding, contractions, leaking of fluid and fetal movement were reviewed in detail with the patient. Please refer to After Visit Summary for other counseling recommendations.   Return in about 2 weeks (around 12/19/2020) for Routine Prenatal Appointment (allow 30 minutes for u/s for  growth with Jean Rosenthal or [facility]).   Thomasene Mohair, MD, [name], [facility] 12/05/2020 1:44 PM

## 2020-12-11 ENCOUNTER — Inpatient Hospital Stay: Payer: Medicaid Other

## 2020-12-11 ENCOUNTER — Other Ambulatory Visit: Payer: Self-pay

## 2020-12-11 DIAGNOSIS — D696 Thrombocytopenia, unspecified: Secondary | ICD-10-CM

## 2020-12-11 DIAGNOSIS — O99013 Anemia complicating pregnancy, third trimester: Secondary | ICD-10-CM | POA: Diagnosis not present

## 2020-12-12 LAB — IRON AND TIBC
Iron: 102 ug/dL (ref 28–170)
Saturation Ratios: 25 % (ref 10.4–31.8)
TIBC: 413 ug/dL (ref 250–450)
UIBC: 311 ug/dL

## 2020-12-12 LAB — FERRITIN: Ferritin: 126 ng/mL (ref 11–307)

## 2020-12-18 ENCOUNTER — Ambulatory Visit (INDEPENDENT_AMBULATORY_CARE_PROVIDER_SITE_OTHER): Payer: Medicaid Other | Admitting: Obstetrics and Gynecology

## 2020-12-18 ENCOUNTER — Other Ambulatory Visit: Payer: Self-pay

## 2020-12-18 VITALS — BP 116/75 | Wt 156.0 lb

## 2020-12-18 DIAGNOSIS — D696 Thrombocytopenia, unspecified: Secondary | ICD-10-CM

## 2020-12-18 DIAGNOSIS — Z862 Personal history of diseases of the blood and blood-forming organs and certain disorders involving the immune mechanism: Secondary | ICD-10-CM

## 2020-12-18 DIAGNOSIS — Z3480 Encounter for supervision of other normal pregnancy, unspecified trimester: Secondary | ICD-10-CM

## 2020-12-18 DIAGNOSIS — O99119 Other diseases of the blood and blood-forming organs and certain disorders involving the immune mechanism complicating pregnancy, unspecified trimester: Secondary | ICD-10-CM

## 2020-12-18 DIAGNOSIS — Z3A35 35 weeks gestation of pregnancy: Secondary | ICD-10-CM

## 2020-12-18 NOTE — Progress Notes (Signed)
Growth scan - RM 2

## 2020-12-18 NOTE — Progress Notes (Addendum)
Patient 15 minutes late for appointment quick bedside growth was obtained.  Fluid subjectively normal.  Vertex presentation, FHT 142.  EFW [redacted]w[redacted]d with EFW of 2313g this was done on portable ultrasound in [facility] consider repeat at [facility].  No charge visit  Blood pressure 116/75, weight 156 lb (70.8 kg), last menstrual period 04/11/2020, unknown if currently breastfeeding.

## 2020-12-19 LAB — CBC
Hematocrit: 39.1 % (ref 34.0–46.6)
Hemoglobin: 13 g/dL (ref 11.1–15.9)
MCH: 31 pg (ref 26.6–33.0)
MCHC: 33.2 g/dL (ref 31.5–35.7)
MCV: 93 fL (ref 79–97)
Platelets: 70 10*3/uL — CL (ref 150–450)
RBC: 4.19 x10E6/uL (ref 3.77–5.28)
RDW: 13.5 % (ref 11.7–15.4)
WBC: 6.4 10*3/uL (ref 3.4–10.8)

## 2020-12-22 ENCOUNTER — Other Ambulatory Visit: Payer: Self-pay

## 2020-12-22 DIAGNOSIS — D509 Iron deficiency anemia, unspecified: Secondary | ICD-10-CM

## 2020-12-22 DIAGNOSIS — D696 Thrombocytopenia, unspecified: Secondary | ICD-10-CM

## 2020-12-25 ENCOUNTER — Ambulatory Visit (INDEPENDENT_AMBULATORY_CARE_PROVIDER_SITE_OTHER): Payer: Medicaid Other | Admitting: Advanced Practice Midwife

## 2020-12-25 ENCOUNTER — Encounter: Payer: Self-pay | Admitting: Oncology

## 2020-12-25 ENCOUNTER — Other Ambulatory Visit: Payer: Self-pay

## 2020-12-25 ENCOUNTER — Inpatient Hospital Stay: Payer: Medicaid Other

## 2020-12-25 ENCOUNTER — Encounter: Payer: Self-pay | Admitting: Advanced Practice Midwife

## 2020-12-25 ENCOUNTER — Other Ambulatory Visit (HOSPITAL_COMMUNITY)
Admission: RE | Admit: 2020-12-25 | Discharge: 2020-12-25 | Disposition: A | Discharge status: Home / Self Care | Payer: Medicaid Other | Source: Ambulatory Visit | Attending: Advanced Practice Midwife | Admitting: Advanced Practice Midwife

## 2020-12-25 ENCOUNTER — Inpatient Hospital Stay (HOSPITAL_BASED_OUTPATIENT_CLINIC_OR_DEPARTMENT_OTHER): Payer: Medicaid Other | Admitting: Oncology

## 2020-12-25 VITALS — BP 100/65 | Wt 156.0 lb

## 2020-12-25 VITALS — BP 100/56 | HR 70 | Temp 98.0°F | Resp 20 | Wt 156.0 lb

## 2020-12-25 DIAGNOSIS — D696 Thrombocytopenia, unspecified: Secondary | ICD-10-CM

## 2020-12-25 DIAGNOSIS — Z369 Encounter for antenatal screening, unspecified: Secondary | ICD-10-CM

## 2020-12-25 DIAGNOSIS — Z113 Encounter for screening for infections with a predominantly sexual mode of transmission: Secondary | ICD-10-CM | POA: Insufficient documentation

## 2020-12-25 DIAGNOSIS — D693 Immune thrombocytopenic purpura: Secondary | ICD-10-CM | POA: Diagnosis not present

## 2020-12-25 DIAGNOSIS — O99019 Anemia complicating pregnancy, unspecified trimester: Secondary | ICD-10-CM | POA: Diagnosis not present

## 2020-12-25 DIAGNOSIS — Z3A36 36 weeks gestation of pregnancy: Secondary | ICD-10-CM | POA: Insufficient documentation

## 2020-12-25 DIAGNOSIS — E538 Deficiency of other specified B group vitamins: Secondary | ICD-10-CM | POA: Diagnosis not present

## 2020-12-25 DIAGNOSIS — O99113 Other diseases of the blood and blood-forming organs and certain disorders involving the immune mechanism complicating pregnancy, third trimester: Secondary | ICD-10-CM | POA: Diagnosis not present

## 2020-12-25 DIAGNOSIS — Z3483 Encounter for supervision of other normal pregnancy, third trimester: Secondary | ICD-10-CM | POA: Insufficient documentation

## 2020-12-25 DIAGNOSIS — O99013 Anemia complicating pregnancy, third trimester: Secondary | ICD-10-CM | POA: Diagnosis not present

## 2020-12-25 DIAGNOSIS — D509 Iron deficiency anemia, unspecified: Secondary | ICD-10-CM

## 2020-12-25 DIAGNOSIS — O0993 Supervision of high risk pregnancy, unspecified, third trimester: Secondary | ICD-10-CM

## 2020-12-25 DIAGNOSIS — Z3685 Encounter for antenatal screening for Streptococcus B: Secondary | ICD-10-CM

## 2020-12-25 DIAGNOSIS — O99119 Other diseases of the blood and blood-forming organs and certain disorders involving the immune mechanism complicating pregnancy, unspecified trimester: Secondary | ICD-10-CM

## 2020-12-25 LAB — CBC WITH DIFFERENTIAL/PLATELET
Abs Immature Granulocytes: 0.04 10*3/uL (ref 0.00–0.07)
Basophils Absolute: 0 10*3/uL (ref 0.0–0.1)
Basophils Relative: 0 %
Eosinophils Absolute: 0 10*3/uL (ref 0.0–0.5)
Eosinophils Relative: 1 %
HCT: 38.6 % (ref 36.0–46.0)
Hemoglobin: 13.1 g/dL (ref 12.0–15.0)
Immature Granulocytes: 1 %
Lymphocytes Relative: 18 %
Lymphs Abs: 1.1 10*3/uL (ref 0.7–4.0)
MCH: 31.8 pg (ref 26.0–34.0)
MCHC: 33.9 g/dL (ref 30.0–36.0)
MCV: 93.7 fL (ref 80.0–100.0)
Monocytes Absolute: 0.3 10*3/uL (ref 0.1–1.0)
Monocytes Relative: 5 %
Neutro Abs: 4.5 10*3/uL (ref 1.7–7.7)
Neutrophils Relative %: 75 %
Platelets: 75 10*3/uL — ABNORMAL LOW (ref 150–400)
RBC: 4.12 MIL/uL (ref 3.87–5.11)
RDW: 14.7 % (ref 11.5–15.5)
WBC: 6 10*3/uL (ref 4.0–10.5)
nRBC: 0 % (ref 0.0–0.2)

## 2020-12-25 LAB — POCT URINALYSIS DIPSTICK OB
Glucose, UA: NEGATIVE
POC,PROTEIN,UA: NEGATIVE

## 2020-12-25 LAB — FOLATE: Folate: 16.4 ng/mL (ref >5.9)

## 2020-12-25 LAB — IRON AND TIBC
Iron: 84 ug/dL (ref 28–170)
Saturation Ratios: 21 % (ref 10.4–31.8)
TIBC: 406 ug/dL (ref 250–450)
UIBC: 322 ug/dL

## 2020-12-25 LAB — FERRITIN: Ferritin: 81 ng/mL (ref 11–307)

## 2020-12-25 LAB — VITAMIN B12: Vitamin B-12: 132 pg/mL — ABNORMAL LOW (ref 180–914)

## 2020-12-25 NOTE — Progress Notes (Signed)
Hematology/Oncology Consult note [facility]  [phone] [phone]  Patient Care Team: Pcp, No as PCP - General   Name of the patient: Carolyn Chavez  [id number]  1991/01/25   Date of visit: 12/25/20  Diagnosis- 1.  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  Iron and B12 deficiency anemia  Chief complaint/ Reason for visit-routine follow-up of anemia and thrombocytopenia  Heme/Onc history: Patient is a 30 year old female and this is her sixth pregnancy and she is currently at 37 weeks of gestation.  She was previously seen by me in her fifth pregnancy as well for thrombocytopenia.  We did not have any platelet counts outside of her pregnancy.  Thrombocytopenia was observed in her fourth and fifth pregnancy in the past.  She did not require any intervention for this including steroids.  Her pregnancy so far have been uneventful with normal vaginal delivery.  No excessive bleeding following delivery   Patient did require IV iron during her fifth pregnancy.  Patient had extensive blood work to evaluate her thrombocytopenia including PT PTT INR fibrinogen LDH levels which were normal.  Smear review was unremarkable.  Labs were also consistent with iron deficiency for which patient received 5 doses of Venofer.  B12 levels low at 140  Interval history-patient reports improvement in her energy levels after receiving IV iron.  Her pregnancy is coming along well and she is currently [redacted] weeks pregnant.  Denies any specific complaints at this time  ECOG PS- 0 Pain scale- 0   Review of systems- Review of Systems  Constitutional:  Negative for chills, fever, malaise/fatigue and weight loss.  HENT:  Negative for congestion, ear discharge and nosebleeds.   Eyes:  Negative for blurred vision.  Respiratory:  Negative for cough, hemoptysis, sputum production, shortness of breath and wheezing.   Cardiovascular:  Negative for chest pain,  palpitations, orthopnea and claudication.  Gastrointestinal:  Negative for abdominal pain, blood in stool, constipation, diarrhea, heartburn, melena, nausea and vomiting.  Genitourinary:  Negative for dysuria, flank pain, frequency, hematuria and urgency.  Musculoskeletal:  Negative for back pain, joint pain and myalgias.  Skin:  Negative for rash.  Neurological:  Negative for dizziness, tingling, focal weakness, seizures, weakness and headaches.  Endo/Heme/Allergies:  Does not bruise/bleed easily.  Psychiatric/Behavioral:  Negative for depression and suicidal ideas. The patient does not have insomnia.      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   History of Papanicolaou smear of cervix [id number]; 04/01/2011   neg; neg, ct/gc neg;    Iron deficiency anemia 09/27/2017   Pica    craves flour   Postpartum care following vaginal delivery 12/23/2014   Supervision of high risk pregnancy, antepartum 05/25/2017   Clinic [facility] Prenatal Labs Dating LMP unsure, dating by 9 week [place] Blood type: O/Positive/-- (11/27 1039)  Genetic Screen 1 Screen: Negative, msAFP - declined   Antibody:Negative (11/27 1039) Anatomic [place] IComplete Rubella: 3.55 (11/27 1039) Varicella: Immune GTT Third trimester: 125 RPR: Non Reactive (11/27 1039)  Rhogam n/a HBsAg: Negative (11/27 1039)  TDaP vaccine                        Flu    Thrombocytopenia (HCC)    Thrombocytopenia affecting pregnancy, antepartum (HCC) 09/28/2017   Suspected  ITP, will need retest of plt count 1-3 months postpartum    UTI (urinary tract infection)      Past Surgical History:  Procedure Laterality Date   COLPOSCOPY      Social History   Socioeconomic History   Marital status: Married    Spouse name: Not on file   Number of children: 4   Years of education: 12   Highest education level: Not on  file  Occupational History   Not on file  Tobacco Use   Smoking status: Former    Pack years: 0.00    Types: Cigarettes    Quit date: 03/22/2014    Years since quitting: 6.7   Smokeless tobacco: Never  Vaping Use   Vaping Use: Never used  Substance and Sexual Activity   Alcohol use: Not Currently   Drug use: No   Sexual activity: Yes    Birth control/protection: Other-see comments  Other Topics Concern   Not on file  Social History Narrative   Not on file   Social Determinants of Health   Financial Resource Strain: Not on file  Food Insecurity: Not on file  Transportation Needs: Not on file  Physical Activity: Not on file  Stress: Not on file  Social Connections: Not on file  Intimate Partner Violence: Not At Risk   Fear of Current or Ex-Partner: No   Emotionally Abused: No   Physically Abused: No   Sexually Abused: No    Family History  Problem Relation Age of Onset   Cancer Maternal Grandfather 60       lung, brain   Hypertension Maternal Grandfather    Skin cancer Maternal Grandfather    Schizophrenia Brother    Hearing loss Daughter        2/16; oldest daughter; low freq loss     Current Outpatient Medications:    Prenatal Vit-Fe Fumarate-FA (PRENATAL MULTIVITAMIN) TABS tablet, Take 1 tablet by mouth daily at 12 noon. , Disp: , Rfl:    SODIUM FLUORIDE 5000 SENSITIVE 1.1-5 % GEL, Take by mouth 2 (two) times daily., Disp: , Rfl:   Physical exam:  Vitals:   12/25/20 1114  BP: (!) 100/56  Pulse: 70  Resp: 20  Temp: 98 F (36.7 C)  TempSrc: Tympanic  SpO2: 100%  Weight: 156 lb (70.8 kg)   Physical Exam Cardiovascular:     Rate and Rhythm: Normal rate and regular rhythm.     Heart sounds: Normal heart sounds.  Pulmonary:     Effort: Pulmonary effort is normal.     Breath sounds: Normal breath sounds.  Abdominal:     Comments: Gravid uterus  Skin:    General: Skin is warm and dry.  Neurological:     Mental Status: She is alert and oriented to  person, place, and time.     CMP Latest Ref Rng & Units 11/05/2020  Glucose 70 - 99 mg/dL 76  BUN 6 - 20 mg/dL 5(L)  Creatinine 7.82 - 1.00 mg/dL 9.56  Sodium 213 - 086 mmol/L 133(L)  Potassium 3.5 - 5.1 mmol/L 3.6  Chloride 98 - 111 mmol/L 104  CO2 22 - 32 mmol/L 23  Calcium 8.9 - 10.3 mg/dL 5.7(Q)  Total Protein 6.5 - 8.1 g/dL 7.1  Total Bilirubin 0.3 - 1.2 mg/dL 0.8  Alkaline Phos 38 - 126 U/L 50  AST 15 - 41 U/L 14(L)  ALT 0 - 44 U/L 8   CBC Latest Ref Rng & Units 12/25/2020  WBC  4.0 - 10.5 K/uL 6.0  Hemoglobin 12.0 - 15.0 g/dL 75.8  Hematocrit 83.2 - 46.0 % 38.6  Platelets 150 - 400 K/uL 75(L)     Assessment and plan- Patient is a 30 y.o. female who is here for follow-up of following issues:  History of iron B12 deficiency anemia: Iron levels from today are pending but ferritin levels were again checked 2 weeks ago which were within normal limits at 126.  Also hemoglobin has improved presently from 11.5-13.1.  B12 levels were found to be low at 140 on 11/26/2020.  I did recommend that the patient should get B12 injection today but she would like to hold off.  She is agreeable to taking oral B12 tablets 1000 mcg daily.  2.  Thrombocytopenia: Possibly secondary to ITP.  Platelets are currently stable in the 70s.  She is currently [redacted] weeks pregnant.  DIC panel and CMP was negative.  Blood pressure is within normal limits.  No intervention needed unless platelet counts are less than 50.  She is planning to proceed with normal delivery at this time with no plans for elective C-section.  For C-section we would like to keep platelets more than 50.  For normal delivery okay to continue to watch if platelets are more than 30.  Repeat CBC ferritin and iron studies and B12 in 3 months followed by video visit.  Weekly CBCs to be checked for the next 3 weeks   Visit Diagnosis 1. B12 deficiency   2. Immune thrombocytopenia affecting pregnancy in third trimester (HCC)   3. Iron deficiency  anemia during pregnancy      Dr. Owens Shark, MD, MPH [facility] [phone] 12/25/2020 12:29 PM

## 2020-12-25 NOTE — Progress Notes (Signed)
Routine Prenatal Care Visit  Subjective  Carolyn Chavez is a 30 y.o. G6P5005 at [redacted]w[redacted]d being seen today for ongoing prenatal care.  She is currently monitored for the following issues for this high-risk pregnancy and has Iron deficiency anemia; Umbilical hernia without obstruction and without gangrene; History of thrombocytopenia; Supervision of high risk pregnancy, antepartum; and Thrombocytopenia affecting pregnancy, antepartum (HCC) on their problem list.  ----------------------------------------------------------------------------------- Patient reports feeling a little better following iron infusions. She has follow up lab work today.   Contractions: Irregular. Vag. Bleeding: Scant.  Movement: Present. Leaking Fluid denies.  ----------------------------------------------------------------------------------- The following portions of the patient's history were reviewed and updated as appropriate: allergies, current medications, past family history, past medical history, past social history, past surgical history and problem list. Problem list updated.  Objective  Blood pressure 100/65, weight 156 lb (70.8 kg), last menstrual period 04/11/2020 Pregravid weight 130 lb (59 kg) Total Weight Gain 26 lb (11.8 kg) Urinalysis: Urine Protein Negative  Urine Glucose Negative  Fetal Status: Fetal Heart Rate (bpm): 140 Fundal Height: 35 cm Movement: Present  Presentation: Vertex  General:  Alert, oriented and cooperative. Patient is in no acute distress.  Skin: Skin is warm and dry. No rash noted.   Cardiovascular: Normal heart rate noted  Respiratory: Normal respiratory effort, no problems with respiration noted  Abdomen: Soft, gravid, appropriate for gestational age. Pain/Pressure: Absent     Pelvic:  Cervical exam performed Dilation: 1.5 Effacement (%): 50 Station: -3, GBS/aptima today  Extremities: Normal range of motion.  Edema: None  Mental Status: Normal mood and affect. Normal behavior.  Normal judgment and thought content.   Assessment   30 y.o. T7S1779 at [redacted]w[redacted]d by  01/16/2021, by Last Menstrual Period presenting for routine prenatal visit  Plan   pregnancy6 Problems (from 04/11/20 to present)    Problem Noted Resolved   Thrombocytopenia affecting pregnancy, antepartum (HCC) 10/22/2020 by Conard Novak, MD No   History of thrombocytopenia 06/06/2020 by Zipporah Plants, CNM No   Overview Signed 10/02/2020  3:43 PM by Conard Novak, MD    - plts 111 at 20 wk cbc [ ]  recheck @ 28 wks       Supervision of other normal pregnancy, antepartum 06/06/2020 by 14/02/2020, CNM No   Overview Addendum 10/02/2020  3:42 PM by 12/02/2020, MD     Nursing Staff Provider  Office Location  [facility] Dating   LMP = 8 wk Conard Novak  Language  English Anatomy US  Complete 4/6  Flu Vaccine  declines Genetic Screen  NIPS:      TDaP vaccine    Hgb A1C or  GTT Early : Third trimester :   Rhogam  Not needed   LAB RESULTS   Feeding Plan  Blood Type O/Positive/-- (12/09 1155)   Contraception  Antibody Negative (12/09 1155)  Circumcision  Rubella 3.79 (12/09 1155)  Pediatrician   RPR Non Reactive (12/09 1155)   Support Person  HBsAg Negative (12/09 1155)   Prenatal Classes  HIV Non Reactive (12/09 1155)    Varicella Immune  BTL Consent Desires to do outpatient was [facility]  (For PCN allergy, check sensitivities)        VBAC Consent  Pap  2021 NIL    Hgb Electro   negative  Covid Unvaccinated, ecourgared vaccination CF      SMA         Pregnancy related diagnoses History of thrombocytopenia in pregnancy History of precipitous deliveries Umbilical  hernia           Preterm labor symptoms and general obstetric precautions including but not limited to vaginal bleeding, contractions, leaking of fluid and fetal movement were reviewed in detail with the patient.    Return for weekly visits x 3.  Tresea Mall, CNM 12/25/2020 10:39 AM

## 2020-12-25 NOTE — Progress Notes (Signed)
ROB- no concerns 

## 2020-12-26 LAB — CERVICOVAGINAL ANCILLARY ONLY
Chlamydia: NEGATIVE
Comment: NEGATIVE
Comment: NEGATIVE
Comment: NORMAL
Neisseria Gonorrhea: NEGATIVE
Trichomonas: NEGATIVE

## 2020-12-27 LAB — STREP GP B NAA: Strep Gp B NAA: NEGATIVE

## 2021-01-01 ENCOUNTER — Other Ambulatory Visit: Payer: Self-pay

## 2021-01-01 ENCOUNTER — Inpatient Hospital Stay: Payer: Medicaid Other | Attending: Oncology

## 2021-01-01 DIAGNOSIS — O99113 Other diseases of the blood and blood-forming organs and certain disorders involving the immune mechanism complicating pregnancy, third trimester: Secondary | ICD-10-CM | POA: Diagnosis not present

## 2021-01-01 DIAGNOSIS — E538 Deficiency of other specified B group vitamins: Secondary | ICD-10-CM

## 2021-01-01 DIAGNOSIS — Z3A37 37 weeks gestation of pregnancy: Secondary | ICD-10-CM | POA: Insufficient documentation

## 2021-01-01 LAB — CBC WITH DIFFERENTIAL/PLATELET
Abs Immature Granulocytes: 0.05 10*3/uL (ref 0.00–0.07)
Basophils Absolute: 0 10*3/uL (ref 0.0–0.1)
Basophils Relative: 0 %
Eosinophils Absolute: 0 10*3/uL (ref 0.0–0.5)
Eosinophils Relative: 1 %
HCT: 38 % (ref 36.0–46.0)
Hemoglobin: 13.1 g/dL (ref 12.0–15.0)
Immature Granulocytes: 1 %
Lymphocytes Relative: 15 %
Lymphs Abs: 1 10*3/uL (ref 0.7–4.0)
MCH: 32 pg (ref 26.0–34.0)
MCHC: 34.5 g/dL (ref 30.0–36.0)
MCV: 92.9 fL (ref 80.0–100.0)
Monocytes Absolute: 0.2 10*3/uL (ref 0.1–1.0)
Monocytes Relative: 4 %
Neutro Abs: 5.2 10*3/uL (ref 1.7–7.7)
Neutrophils Relative %: 79 %
Platelets: 84 10*3/uL — ABNORMAL LOW (ref 150–400)
RBC: 4.09 MIL/uL (ref 3.87–5.11)
RDW: 14.7 % (ref 11.5–15.5)
WBC: 6.5 10*3/uL (ref 4.0–10.5)
nRBC: 0 % (ref 0.0–0.2)

## 2021-01-01 LAB — COMPREHENSIVE METABOLIC PANEL
ALT: 8 U/L (ref 0–44)
AST: 16 U/L (ref 15–41)
Albumin: 3.3 g/dL — ABNORMAL LOW (ref 3.5–5.0)
Alkaline Phosphatase: 71 U/L (ref 38–126)
Anion gap: 7 (ref 5–15)
BUN: 7 mg/dL (ref 6–20)
CO2: 24 mmol/L (ref 22–32)
Calcium: 8.5 mg/dL — ABNORMAL LOW (ref 8.9–10.3)
Chloride: 103 mmol/L (ref 98–111)
Creatinine, Ser: 0.59 mg/dL (ref 0.44–1.00)
GFR, Estimated: >60 mL/min (ref >60)
Glucose, Bld: 106 mg/dL — ABNORMAL HIGH (ref 70–99)
Potassium: 3.7 mmol/L (ref 3.5–5.1)
Sodium: 134 mmol/L — ABNORMAL LOW (ref 135–145)
Total Bilirubin: 0.9 mg/dL (ref 0.3–1.2)
Total Protein: 6.8 g/dL (ref 6.5–8.1)

## 2021-01-02 ENCOUNTER — Encounter: Payer: Self-pay | Admitting: Obstetrics and Gynecology

## 2021-01-02 ENCOUNTER — Ambulatory Visit (INDEPENDENT_AMBULATORY_CARE_PROVIDER_SITE_OTHER): Payer: Medicaid Other | Admitting: Obstetrics and Gynecology

## 2021-01-02 VITALS — BP 96/60 | Ht 67.0 in | Wt 157.4 lb

## 2021-01-02 DIAGNOSIS — Z3A38 38 weeks gestation of pregnancy: Secondary | ICD-10-CM

## 2021-01-02 DIAGNOSIS — O0993 Supervision of high risk pregnancy, unspecified, third trimester: Secondary | ICD-10-CM

## 2021-01-02 DIAGNOSIS — D696 Thrombocytopenia, unspecified: Secondary | ICD-10-CM

## 2021-01-02 DIAGNOSIS — O99119 Other diseases of the blood and blood-forming organs and certain disorders involving the immune mechanism complicating pregnancy, unspecified trimester: Secondary | ICD-10-CM

## 2021-01-02 NOTE — Patient Instructions (Signed)
Induction Information: Your induction date: 01/09/21 at midnight (as 7/13 turns to 7/14) Covid test: 7/12 between 9-10AM. [facility] to [facility]-through.  Wear a mask and stay in your car.   This should not take long.  On your induction date, go to the ER a little before your induction time and let them know you're there for your labor induction.

## 2021-01-02 NOTE — Progress Notes (Signed)
Routine Prenatal Care Visit  Subjective  Carolyn Chavez is a 30 y.o. G6P5005 at [redacted]w[redacted]d being seen today for ongoing prenatal care.  She is currently monitored for the following issues for this low-risk pregnancy and has Iron deficiency anemia; Umbilical hernia without obstruction and without gangrene; History of thrombocytopenia; Supervision of high risk pregnancy, antepartum; and Thrombocytopenia affecting pregnancy, antepartum (HCC) on their problem list.  ----------------------------------------------------------------------------------- Patient reports no complaints.   Contractions: Irregular. Vag. Bleeding: None.  Movement: Present. Leaking Fluid denies.  ----------------------------------------------------------------------------------- The following portions of the patient's history were reviewed and updated as appropriate: allergies, current medications, past family history, past medical history, past social history, past surgical history and problem list. Problem list updated.  Objective  Blood pressure 96/60, height 5\' 7"  (1.702 m), weight 157 lb 6.4 oz (71.4 kg), last menstrual period 04/11/2020, unknown if currently breastfeeding. Pregravid weight 130 lb (59 kg) Total Weight Gain 27 lb 6.4 oz (12.4 kg) Urinalysis: Urine Protein    Urine Glucose    Fetal Status: Fetal Heart Rate (bpm): 140 Fundal Height: 37 cm Movement: Present  Presentation: Vertex  General:  Alert, oriented and cooperative. Patient is in no acute distress.  Skin: Skin is warm and dry. No rash noted.   Cardiovascular: Normal heart rate noted  Respiratory: Normal respiratory effort, no problems with respiration noted  Abdomen: Soft, gravid, appropriate for gestational age. Pain/Pressure: Absent     Pelvic:  Cervical exam performed Dilation: 4.5 Effacement (%): 50 Station: -3  Extremities: Normal range of motion.  Edema: None  Mental Status: Normal mood and affect. Normal behavior. Normal judgment and thought  content.   Assessment   30 y.o. 34 at [redacted]w[redacted]d by  01/16/2021, by Last Menstrual Period presenting for routine prenatal visit  Plan   pregnancy6 Problems (from 04/11/20 to present)     Problem Noted Resolved   Thrombocytopenia affecting pregnancy, antepartum (HCC) 10/22/2020 by 10/24/2020, MD No   History of thrombocytopenia 06/06/2020 by 14/02/2020, CNM No   Overview Signed 10/02/2020  3:43 PM by 12/02/2020, MD    - plts 111 at 20 wk cbc [ ]  recheck @ 28 wks       Supervision of high risk pregnancy, antepartum 06/06/2020 by , CNM No   Overview Addendum 10/02/2020  3:42 PM by Zipporah Plants, MD     Nursing Staff Provider  Office Location  [facility] Dating   LMP = 8 wk 12/02/2020  Language  English Anatomy Conard Novak  Complete 4/6  Flu Vaccine  declines Genetic Screen  NIPS:      TDaP vaccine    Hgb A1C or  GTT Early : Third trimester :   Rhogam  Not needed   LAB RESULTS   Feeding Plan  Blood Type O/Positive/-- (12/09 1155)   Contraception  Antibody Negative (12/09 1155)  Circumcision  Rubella 3.79 (12/09 1155)  Pediatrician   RPR Non Reactive (12/09 1155)   Support Person  HBsAg Negative (12/09 1155)   Prenatal Classes  HIV Non Reactive (12/09 1155)    Varicella Immune  BTL Consent Desires to do outpatient was [facility]  (For PCN allergy, check sensitivities)        VBAC Consent  Pap  2021 NIL    Hgb Electro   negative  Covid Unvaccinated, ecourgared vaccination CF      SMA        Pregnancy related diagnoses History of thrombocytopenia in pregnancy History of precipitous deliveries  Umbilical hernia            Term labor symptoms and general obstetric precautions including but not limited to vaginal bleeding, contractions, leaking of fluid and fetal movement were reviewed in detail with the patient. Please refer to After Visit Summary for other counseling recommendations.   IOL scheduled for 7/14 at midnight (as 7/13 turns to 7/14) for history of  precipitous labor and advanced cervical dilation.   Return in about 1 week (around 01/09/2021) for IOL at [facility] on 7/14.   Thomasene Mohair, MD, [name], [facility] 01/02/2021 11:23 AM

## 2021-01-03 ENCOUNTER — Other Ambulatory Visit: Payer: Self-pay

## 2021-01-03 ENCOUNTER — Encounter: Payer: Self-pay | Admitting: Obstetrics and Gynecology

## 2021-01-03 ENCOUNTER — Encounter: Payer: Self-pay | Admitting: Nurse Practitioner

## 2021-01-03 ENCOUNTER — Inpatient Hospital Stay
Admission: EM | Admit: 2021-01-03 | Discharge: 2021-01-05 | DRG: 807 | Disposition: A | Discharge status: Home / Self Care | Payer: Medicaid Other | Attending: Obstetrics and Gynecology | Admitting: Obstetrics and Gynecology

## 2021-01-03 DIAGNOSIS — O9912 Other diseases of the blood and blood-forming organs and certain disorders involving the immune mechanism complicating childbirth: Secondary | ICD-10-CM | POA: Diagnosis present

## 2021-01-03 DIAGNOSIS — Z20822 Contact with and (suspected) exposure to covid-19: Secondary | ICD-10-CM | POA: Diagnosis present

## 2021-01-03 DIAGNOSIS — D696 Thrombocytopenia, unspecified: Secondary | ICD-10-CM | POA: Diagnosis present

## 2021-01-03 DIAGNOSIS — Z3A38 38 weeks gestation of pregnancy: Secondary | ICD-10-CM | POA: Diagnosis not present

## 2021-01-03 DIAGNOSIS — O0990 Supervision of high risk pregnancy, unspecified, unspecified trimester: Secondary | ICD-10-CM

## 2021-01-03 DIAGNOSIS — O26893 Other specified pregnancy related conditions, third trimester: Secondary | ICD-10-CM | POA: Diagnosis present

## 2021-01-03 DIAGNOSIS — Z87891 Personal history of nicotine dependence: Secondary | ICD-10-CM

## 2021-01-03 DIAGNOSIS — Z862 Personal history of diseases of the blood and blood-forming organs and certain disorders involving the immune mechanism: Secondary | ICD-10-CM

## 2021-01-03 DIAGNOSIS — O99119 Other diseases of the blood and blood-forming organs and certain disorders involving the immune mechanism complicating pregnancy, unspecified trimester: Secondary | ICD-10-CM | POA: Diagnosis present

## 2021-01-03 DIAGNOSIS — O479 False labor, unspecified: Secondary | ICD-10-CM | POA: Diagnosis present

## 2021-01-03 LAB — CBC
HCT: 39.3 % (ref 36.0–46.0)
Hemoglobin: 13.5 g/dL (ref 12.0–15.0)
MCH: 32.1 pg (ref 26.0–34.0)
MCHC: 34.4 g/dL (ref 30.0–36.0)
MCV: 93.6 fL (ref 80.0–100.0)
Platelets: 89 10*3/uL — ABNORMAL LOW (ref 150–400)
RBC: 4.2 MIL/uL (ref 3.87–5.11)
RDW: 14.6 % (ref 11.5–15.5)
WBC: 7.1 10*3/uL (ref 4.0–10.5)
nRBC: 0 % (ref 0.0–0.2)

## 2021-01-03 LAB — TYPE AND SCREEN
ABO/RH(D): O POS
Antibody Screen: NEGATIVE

## 2021-01-03 LAB — RESP PANEL BY RT-PCR (FLU A&B, COVID) ARPGX2
Influenza A by PCR: NEGATIVE
Influenza B by PCR: NEGATIVE
SARS Coronavirus 2 by RT PCR: NEGATIVE

## 2021-01-03 LAB — RAPID HIV SCREEN (HIV 1/2 AB+AG)
HIV 1/2 Antibodies: NONREACTIVE
HIV-1 P24 Antigen - HIV24: NONREACTIVE

## 2021-01-03 MED ORDER — ONDANSETRON HCL 4 MG/2ML IJ SOLN: 4.0000 mg | INTRAMUSCULAR | Status: DC | PRN

## 2021-01-03 MED ORDER — ACETAMINOPHEN 325 MG PO TABS
650.0000 mg | ORAL_TABLET | ORAL | Status: DC | PRN
  Administered 2021-01-03 – 2021-01-05 (×3): 650 mg via ORAL
  Filled 2021-01-03 (×3): qty 2

## 2021-01-03 MED ORDER — DIPHENHYDRAMINE HCL 25 MG PO CAPS: 25.0000 mg | ORAL_CAPSULE | Freq: Four times a day (QID) | ORAL | Status: DC | PRN

## 2021-01-03 MED ORDER — SOD CITRATE-CITRIC ACID 500-334 MG/5ML PO SOLN: 30.0000 mL | ORAL | Status: DC | PRN

## 2021-01-03 MED ORDER — DIBUCAINE (PERIANAL) 1 % EX OINT: 1.0000 "application " | TOPICAL_OINTMENT | CUTANEOUS | Status: DC | PRN

## 2021-01-03 MED ORDER — PRENATAL MULTIVITAMIN CH
1.0000 | ORAL_TABLET | Freq: Every day | ORAL | Status: DC
  Filled 2021-01-03: qty 1

## 2021-01-03 MED ORDER — OXYTOCIN-SODIUM CHLORIDE 30-0.9 UT/500ML-% IV SOLN
INTRAVENOUS | Status: AC
  Administered 2021-01-03: 333 mL via INTRAVENOUS
  Filled 2021-01-03: qty 500

## 2021-01-03 MED ORDER — PRENATAL MULTIVITAMIN CH
1.0000 | ORAL_TABLET | Freq: Every day | ORAL | Status: DC
  Administered 2021-01-03 – 2021-01-04 (×2): 1 via ORAL
  Filled 2021-01-03: qty 1

## 2021-01-03 MED ORDER — BENZOCAINE-MENTHOL 20-0.5 % EX AERO
INHALATION_SPRAY | CUTANEOUS | Status: AC
  Administered 2021-01-03: 1 via TOPICAL
  Filled 2021-01-03: qty 56

## 2021-01-03 MED ORDER — TETANUS-DIPHTH-ACELL PERTUSSIS 5-2.5-18.5 LF-MCG/0.5 IM SUSY
0.5000 mL | PREFILLED_SYRINGE | Freq: Once | INTRAMUSCULAR | Status: DC
  Filled 2021-01-03: qty 0.5

## 2021-01-03 MED ORDER — OXYCODONE HCL 5 MG PO TABS
5.0000 mg | ORAL_TABLET | ORAL | Status: DC | PRN
Start: 2021-01-03 — End: 2021-01-05
  Administered 2021-01-03: 5 mg via ORAL
  Filled 2021-01-03: qty 1

## 2021-01-03 MED ORDER — SENNOSIDES-DOCUSATE SODIUM 8.6-50 MG PO TABS
2.0000 | ORAL_TABLET | Freq: Every day | ORAL | Status: DC
  Administered 2021-01-04 – 2021-01-05 (×2): 2 via ORAL
  Filled 2021-01-03 (×2): qty 2

## 2021-01-03 MED ORDER — BENZOCAINE-MENTHOL 20-0.5 % EX AERO: 1.0000 "application " | INHALATION_SPRAY | CUTANEOUS | Status: DC | PRN

## 2021-01-03 MED ORDER — OXYTOCIN BOLUS FROM INFUSION: 333.0000 mL | Freq: Once | INTRAVENOUS | Status: AC

## 2021-01-03 MED ORDER — ACETAMINOPHEN 325 MG PO TABS: 650.0000 mg | ORAL_TABLET | ORAL | Status: DC | PRN

## 2021-01-03 MED ORDER — OXYTOCIN-SODIUM CHLORIDE 30-0.9 UT/500ML-% IV SOLN
2.5000 [IU]/h | INTRAVENOUS | Status: DC
  Administered 2021-01-03: 2.5 [IU]/h via INTRAVENOUS

## 2021-01-03 MED ORDER — LIDOCAINE HCL (PF) 1 % IJ SOLN
INTRAMUSCULAR | Status: AC
  Filled 2021-01-03: qty 30

## 2021-01-03 MED ORDER — BUTORPHANOL TARTRATE 1 MG/ML IJ SOLN: 2.0000 mg | INTRAMUSCULAR | Status: DC | PRN

## 2021-01-03 MED ORDER — ONDANSETRON HCL 4 MG PO TABS
4.0000 mg | ORAL_TABLET | ORAL | Status: DC | PRN
  Filled 2021-01-03: qty 1

## 2021-01-03 MED ORDER — SIMETHICONE 80 MG PO CHEW: 80.0000 mg | CHEWABLE_TABLET | ORAL | Status: DC | PRN

## 2021-01-03 MED ORDER — MISOPROSTOL 200 MCG PO TABS
ORAL_TABLET | ORAL | Status: AC
  Filled 2021-01-03: qty 4

## 2021-01-03 MED ORDER — AMMONIA AROMATIC IN INHA
RESPIRATORY_TRACT | Status: AC
  Filled 2021-01-03: qty 10

## 2021-01-03 MED ORDER — IBUPROFEN 600 MG PO TABS
600.0000 mg | ORAL_TABLET | Freq: Four times a day (QID) | ORAL | Status: DC
  Administered 2021-01-03 – 2021-01-05 (×8): 600 mg via ORAL
  Filled 2021-01-03 (×8): qty 1

## 2021-01-03 MED ORDER — COCONUT OIL OIL
1.0000 "application " | TOPICAL_OIL | Status: DC | PRN
  Filled 2021-01-03 (×3): qty 120

## 2021-01-03 MED ORDER — OXYTOCIN 10 UNIT/ML IJ SOLN
INTRAMUSCULAR | Status: AC
  Filled 2021-01-03: qty 2

## 2021-01-03 MED ORDER — LACTATED RINGERS IV SOLN: 500.0000 mL | INTRAVENOUS | Status: DC | PRN

## 2021-01-03 MED ORDER — ONDANSETRON HCL 4 MG/2ML IJ SOLN: 4.0000 mg | Freq: Four times a day (QID) | INTRAMUSCULAR | Status: DC | PRN

## 2021-01-03 MED ORDER — LIDOCAINE HCL (PF) 1 % IJ SOLN: 30.0000 mL | INTRAMUSCULAR | Status: DC | PRN

## 2021-01-03 MED ORDER — WITCH HAZEL-GLYCERIN EX PADS
1.0000 "application " | MEDICATED_PAD | CUTANEOUS | Status: DC | PRN
  Administered 2021-01-03: 1 via TOPICAL

## 2021-01-03 MED ORDER — LACTATED RINGERS IV SOLN: INTRAVENOUS | Status: DC

## 2021-01-03 NOTE — H&P (Signed)
Carolyn Chavez is an 30 y.o. female.   Chief Complaint: Uterine Contractions  HPI: Patient presents today to labor and delivery complaining of contractions which started at 4 AM.  She is reporting normal fetal movements.  She denies any leakage of fluid or bleeding.   Category ! Tracing. SVE per nursing 6/80/-1.  pregnancy6 Problems (from 04/11/20 to present)     Problem Noted Resolved   Thrombocytopenia affecting pregnancy, antepartum (HCC) 10/22/2020 by Conard Novak, MD No   History of thrombocytopenia 06/06/2020 by Zipporah Plants, CNM No   Overview Signed 10/02/2020  3:43 PM by Conard Novak, MD    - plts 111 at 20 wk cbc [ ]  recheck @ 28 wks       Supervision of high risk pregnancy, antepartum 06/06/2020 by 14/02/2020, CNM No   Overview Addendum 10/02/2020  3:42 PM by 12/02/2020, MD     Nursing Staff Provider  Office Location  [facility] Dating   LMP = 8 wk Conard Novak  Language  English Anatomy US  Complete 4/6  Flu Vaccine  declines Genetic Screen  NIPS:      TDaP vaccine    Hgb A1C or  GTT Early : Third trimester :   Rhogam  Not needed   LAB RESULTS   Feeding Plan  Blood Type O/Positive/-- (12/09 1155)   Contraception  Antibody Negative (12/09 1155)  Circumcision  Rubella 3.79 (12/09 1155)  Pediatrician   RPR Non Reactive (12/09 1155)   Support Person  HBsAg Negative (12/09 1155)   Prenatal Classes  HIV Non Reactive (12/09 1155)    Varicella Immune  BTL Consent Desires to do outpatient was [facility]  (For PCN allergy, check sensitivities)        VBAC Consent  Pap  2021 NIL    Hgb Electro   negative  Covid Unvaccinated, ecourgared vaccination CF      SMA        Pregnancy related diagnoses History of thrombocytopenia in pregnancy History of precipitous deliveries Umbilical hernia             Past Medical History:  Diagnosis Date   Abnormal Pap smear of cervix    age 82   Anemia affecting pregnancy in second trimester 07/30/2017   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   History of Papanicolaou smear of cervix 11/28/2017; 04/01/2011   neg; neg, ct/gc neg;    Iron deficiency anemia 09/27/2017   Pica    craves flour   Postpartum care following vaginal delivery 12/23/2014   Supervision of high risk pregnancy, antepartum 05/25/2017   Clinic [facility] Prenatal Labs Dating LMP unsure, dating by 9 week 05/27/2017 Blood type: O/Positive/-- (11/27 1039)  Genetic Screen 1 Screen: Negative, msAFP - declined   Antibody:Negative (11/27 1039) Anatomic 02-27-1974 IComplete Rubella: 3.55 (11/27 1039) Varicella: Immune GTT Third trimester: 125 RPR: Non Reactive (11/27 1039)  Rhogam n/a HBsAg: Negative (11/27 1039)  TDaP vaccine                        Flu    Thrombocytopenia (HCC)    Thrombocytopenia affecting pregnancy, antepartum (HCC) 09/28/2017   Suspected ITP, will need retest of plt count 1-3 months postpartum    UTI (urinary tract infection)     Past Surgical History:  Procedure Laterality Date   COLPOSCOPY      Family History  Problem Relation Age of Onset   Cancer Maternal Grandfather 37  lung, brain   Hypertension Maternal Grandfather    Skin cancer Maternal Grandfather    Schizophrenia Brother    Hearing loss Daughter        2/16; oldest daughter; low freq loss   Social History:  reports that she quit smoking about 6 years ago. She has never used smokeless tobacco. She reports previous alcohol use. She reports that she does not use drugs.  Allergies: No Known Allergies  Medications Prior to Admission  Medication Sig Dispense Refill   Prenatal Vit-Fe Fumarate-FA (PRENATAL MULTIVITAMIN) TABS tablet Take 1 tablet by mouth daily at 12 noon.      SODIUM FLUORIDE 5000 SENSITIVE 1.1-5 % GEL Take by mouth 2 (two) times daily.      Results for orders placed or performed in visit on 01/01/21 (from the past 48 hour(s))  CBC with Differential/Platelet     Status: Abnormal   Collection Time: 01/01/21  2:10 PM  Result Value Ref Range   WBC 6.5 4.0 - 10.5 K/uL   RBC  4.09 3.87 - 5.11 MIL/uL   Hemoglobin 13.1 12.0 - 15.0 g/dL   HCT 79.0 24.0 - 97.3 %   MCV 92.9 80.0 - 100.0 fL   MCH 32.0 26.0 - 34.0 pg   MCHC 34.5 30.0 - 36.0 g/dL   RDW 53.2 99.2 - 42.6 %   Platelets 84 (L) 150 - 400 K/uL    Comment: Immature Platelet Fraction may be clinically indicated, consider ordering this additional test [id number]    nRBC 0.0 0.0 - 0.2 %   Neutrophils Relative % 79 %   Neutro Abs 5.2 1.7 - 7.7 K/uL   Lymphocytes Relative 15 %   Lymphs Abs 1.0 0.7 - 4.0 K/uL   Monocytes Relative 4 %   Monocytes Absolute 0.2 0.1 - 1.0 K/uL   Eosinophils Relative 1 %   Eosinophils Absolute 0.0 0.0 - 0.5 K/uL   Basophils Relative 0 %   Basophils Absolute 0.0 0.0 - 0.1 K/uL   Immature Granulocytes 1 %   Abs Immature Granulocytes 0.05 0.00 - 0.07 K/uL    Comment: Performed at [facility], [address], [place], [place] [address]  Comprehensive metabolic panel     Status: Abnormal   Collection Time: 01/01/21  2:10 PM  Result Value Ref Range   Sodium 134 (L) 135 - 145 mmol/L   Potassium 3.7 3.5 - 5.1 mmol/L   Chloride 103 98 - 111 mmol/L   CO2 24 22 - 32 mmol/L   Glucose, Bld 106 (H) 70 - 99 mg/dL    Comment: Glucose reference range applies only to samples taken after fasting for at least 8 hours.   BUN 7 6 - 20 mg/dL   Creatinine, Ser 9.21 0.44 - 1.00 mg/dL   Calcium 8.5 (L) 8.9 - 10.3 mg/dL   Total Protein 6.8 6.5 - 8.1 g/dL   Albumin 3.3 (L) 3.5 - 5.0 g/dL   AST 16 15 - 41 U/L   ALT 8 0 - 44 U/L   Alkaline Phosphatase 71 38 - 126 U/L   Total Bilirubin 0.9 0.3 - 1.2 mg/dL   GFR, Estimated >19 >41 mL/min    Comment: (NOTE) Calculated using the CKD-EPI Creatinine Equation (2021)    Anion gap 7 5 - 15    Comment: Performed at [facility], [address], [place], [place] [address]   No results found.  Review of Systems  Constitutional:  Negative for chills and fever.  HENT:  Negative for congestion, hearing loss and sinus pain.    Respiratory:  Negative for cough, shortness of breath and wheezing.   Cardiovascular:  Negative for chest pain, palpitations and leg swelling.  Gastrointestinal:  Negative for abdominal pain, constipation, diarrhea, nausea and vomiting.  Genitourinary:  Negative for dysuria, flank pain, frequency, hematuria and urgency.  Musculoskeletal:  Negative for back pain.  Skin:  Negative for rash.  Neurological:  Negative for dizziness and headaches.  Psychiatric/Behavioral:  Negative for suicidal ideas. The patient is not nervous/anxious.    Blood pressure 128/77, pulse 74, temperature 98.3 F (36.8 C), temperature source Oral, resp. rate 16, height 5\' 7"  (1.702 m), weight 71.2 kg, last menstrual period 04/11/2020, unknown if currently breastfeeding. Physical Exam Vitals and nursing note reviewed.  Constitutional:      Appearance: Normal appearance. She is well-developed.  HENT:     Head: Normocephalic and atraumatic.  Cardiovascular:     Rate and Rhythm: Normal rate and regular rhythm.  Pulmonary:     Effort: Pulmonary effort is normal.     Breath sounds: Normal breath sounds.  Abdominal:     General: Bowel sounds are normal.     Palpations: Abdomen is soft.  Musculoskeletal:        General: Normal range of motion.  Skin:    General: Skin is warm and dry.  Neurological:     Mental Status: She is alert and oriented to person, place, and time.  Psychiatric:        Behavior: Behavior normal.        Thought Content: Thought content normal.        Judgment: Judgment normal.    Assessment/Plan  30 y.o. 04/13/2020 [redacted]w[redacted]d here for normal labor.  Expectant management of labor.  Normal fetal monitoring per unit policy Reviewed option for pain management with patient. Patient does not desire and epidural.  Normal admission labs GBS: negative History of thrombocytopenia  [redacted]w[redacted]d MD, Adelene Idler OB/[facility], [facility] 01/03/2021 7:23 AM

## 2021-01-03 NOTE — Progress Notes (Signed)
Discussed contraception w patient Pros and cons of permanent sterilization Tubal papers to be signed to allow for time lapse for decision  The patient has been fully informed about all methods of contraception, both temporary and permanent. She understands that tubal ligation is meant to be permanent, absolute and irreversible. She was told that there is an approximately 1 in 400 chance of a pregnancy in the future after tubal ligation. She was told the short and long term complications of tubal ligation. She understands the risks from this surgery include, but are not limited to, the risks of anesthesia, hemorrhage, infection, perforation, and injury to adjacent structures, bowel, bladder and blood vessels.   Consider scheduling interval tubal as pt decides  Annamarie Major, MD, [name], [facility] 01/03/2021  5:11 PM

## 2021-01-03 NOTE — OB Triage Note (Signed)
Pt is a 30y/o G6P5 at [redacted]w[redacted]d with c/o contractions that began at 4am rating at 10/10 . Pt states +FM. Pt denies LOF and VB. Monitors applied and assessing. Initial FHT 130.

## 2021-01-03 NOTE — Progress Notes (Signed)
Carolyn Chavez is stable after delivery.Pt's support person is at bedside. Pt bonding with infant and performing skin to skin after delivery. Pt is stable and ambulated to the bathroom, voided a sufficient amount, and tolerated activity well. Pt ambulated to the wheelchair and transferred to mother/baby unit RM [id number]  for couplet care. Report given to Anastasio Champion

## 2021-01-03 NOTE — Discharge Summary (Signed)
OB Discharge Summary     Patient Name: Carolyn Chavez DOB: 05-27-1991 MRN: [id number]  Date of admission: 01/03/2021 Delivering provider: Tresea Mall, CNM  Date of Delivery: 01/03/2021  Date of discharge: 01/05/2021  Admitting diagnosis: Uterine contractions during pregnancy [O47.9] Intrauterine pregnancy: [redacted]w[redacted]d     Secondary diagnosis:  thrombocytopenia     Discharge diagnosis: Term Pregnancy Delivered                                                                                                Post partum procedures: none  Augmentation: N/A  Complications: None  Hospital course:  Onset of Labor With Vaginal Delivery      30 y.o. yo [id number] at [redacted]w[redacted]d was admitted in Active Labor on 01/03/2021. Patient had an uncomplicated labor course as follows:  Membrane Rupture Time/Date: 8:45 AM ,01/03/2021   Delivery Method:Vaginal, Spontaneous  Episiotomy: None  Lacerations:  None  See delivery note for details of delivery  Patient had an uncomplicated postpartum course.  She is tolerating regular diet. Her pain is controlled with PO medications. She is ambulating and voiding without difficulty.   Patient is discharged home in stable condition on 01/05/21.  Newborn Data: Birth date:01/03/2021  Birth time:9:29 AM  Gender:Female Carolyn Chavez Living status:Living  Apgars:8 ,9  Weight: 2840 g, 6#4oz  Physical exam  Vitals:   01/04/21 0724 01/04/21 1525 01/04/21 2332 01/05/21 0800  BP: 104/65 113/68 109/62 101/66  Pulse: 67 70 65 80  Resp: 18 18 20 18   Temp: 97.6 F (36.4 C) 98.3 F (36.8 C) 97.8 F (36.6 C) 98 F (36.7 C)  TempSrc: Oral Oral  Oral  SpO2: 100% 97% 99% 98%  Weight:      Height:       General: alert, cooperative, and no distress Lochia: appropriate Uterine Fundus: firm Incision: N/A DVT Evaluation: No evidence of DVT seen on physical exam. Negative Homan's sign.  Labs: Lab Results  Component Value Date   WBC 8.5 01/04/2021   HGB 11.9 (L) 01/04/2021   HCT 34.9  (L) 01/04/2021   MCV 94.1 01/04/2021   PLT 88 (L) 01/04/2021    Discharge instruction: per After Visit Summary.  Medications:  Allergies as of 01/05/2021   No Known Allergies      Medication List     TAKE these medications    ibuprofen 600 MG tablet Commonly known as: ADVIL Take 1 tablet (600 mg total) by mouth every 6 (six) hours.   prenatal multivitamin Tabs tablet Take 1 tablet by mouth daily at 12 noon.   Sodium Fluoride 5000 Sensitive 1.1-5 % Gel Generic drug: Sod Fluoride-Potassium Nitrate Take by mouth 2 (two) times daily.        Diet: routine diet  Activity: Advance as tolerated. Pelvic rest for 6 weeks.   Outpatient follow up:  Follow-up Information     03/08/2021, CNM. Schedule an appointment as soon as possible for a visit in 3 week(s).   Specialty: Obstetrics Why: with MD for tubal planning and 6 week visit with CNM [facility] information: [address] [place] [phone]  Postpartum contraception:  plans interval tubal Rhogam Given postpartum: NA Rubella vaccine given postpartum: immune Varicella vaccine given postpartum: immune TDaP given antepartum or postpartum: antepartally   Newborn Delivery   Birth date/time: 01/03/2021 09:29:00 Delivery type: Vaginal, Spontaneous       Baby Feeding: Breast  Disposition:home with mother  SIGNED:  Mirna Mires, CNM 01/05/2021 10:15 AM

## 2021-01-03 NOTE — Lactation Note (Signed)
This note was copied from a baby's chart. Lactation Consultation Note  Patient Name: Girl Destanee Bedonie [id number] Date: 01/03/2021 Reason for consult: Initial assessment;Early term 37-38.6wks Age:32 hours  Maternal Data Has patient been taught Hand Expression?: Yes Does the patient have breastfeeding experience prior to this delivery?: Yes How long did the patient breastfeed?: 1 yr  Feeding Mother's Current Feeding Choice: Breast Milk Baby has not fed since 1100, awakened and changed diaper of mec, baby gaggy and swallowing mucous, spit small amt mucous, placed beside mom in right side lying position, baby would not open mouth despite stimulation and mom expressing colostrum on baby's lips, mom to continue to attempt.  LATCH Score Latch: Too sleepy or reluctant, no latch achieved, no sucking elicited.  Audible Swallowing: None  Type of Nipple: Everted at rest and after stimulation  Comfort (Breast/Nipple): Soft / non-tender  Hold (Positioning): Assistance needed to correctly position infant at breast and maintain latch.  LATCH Score: 5   Lactation Tools Discussed/Used  LC name and no written on white board, if baby does not nurse in next hour , mom to hand express colostrum into spoon, to spoon feed or syringe feed    Interventions Interventions: Breast feeding basics reviewed;Assisted with latch;Skin to skin;Hand express;Adjust position;Expressed milk;Education  Discharge Pump: Personal WIC Program: No  Consult Status Consult Status: PRN    Dyann Kief 01/03/2021, 5:26 PM

## 2021-01-04 LAB — CBC
HCT: 34.9 % — ABNORMAL LOW (ref 36.0–46.0)
Hemoglobin: 11.9 g/dL — ABNORMAL LOW (ref 12.0–15.0)
MCH: 32.1 pg (ref 26.0–34.0)
MCHC: 34.1 g/dL (ref 30.0–36.0)
MCV: 94.1 fL (ref 80.0–100.0)
Platelets: 88 10*3/uL — ABNORMAL LOW (ref 150–400)
RBC: 3.71 MIL/uL — ABNORMAL LOW (ref 3.87–5.11)
RDW: 14.6 % (ref 11.5–15.5)
WBC: 8.5 10*3/uL (ref 4.0–10.5)
nRBC: 0 % (ref 0.0–0.2)

## 2021-01-04 LAB — RPR: RPR Ser Ql: NONREACTIVE

## 2021-01-04 NOTE — Progress Notes (Signed)
Post Partum Day 1 Subjective: no complaints, up ad lib, voiding, and tolerating POShe would like to be discharged tomorrow.  Objective: Blood pressure 104/65, pulse 67, temperature 97.6 F (36.4 C), temperature source Oral, resp. rate 18, height 5\' 7"  (1.702 m), weight 71.2 kg, last menstrual period 04/11/2020, SpO2 100 %, unknown if currently breastfeeding.  Physical Exam:  General: alert, cooperative, and no distress Lochia: appropriate Uterine Fundus: firm Incision: healing well DVT Evaluation: No evidence of DVT seen on physical exam. Negative Homan's sign.  Recent Labs    01/03/21 0701 01/04/21 0410  HGB 13.5 11.9*  HCT 39.3 34.9*    Assessment/Plan: Plan for discharge tomorrow, Breastfeeding, and Contraception Desires interval BTL   LOS: 1 day   03/07/21 01/04/2021, 10:47 AM

## 2021-01-05 LAB — CBC
HCT: 33.2 % — ABNORMAL LOW (ref 36.0–46.0)
Hemoglobin: 11.4 g/dL — ABNORMAL LOW (ref 12.0–15.0)
MCH: 32.8 pg (ref 26.0–34.0)
MCHC: 34.3 g/dL (ref 30.0–36.0)
MCV: 95.4 fL (ref 80.0–100.0)
Platelets: 89 10*3/uL — ABNORMAL LOW (ref 150–400)
RBC: 3.48 MIL/uL — ABNORMAL LOW (ref 3.87–5.11)
RDW: 14.7 % (ref 11.5–15.5)
WBC: 6.2 10*3/uL (ref 4.0–10.5)
nRBC: 0 % (ref 0.0–0.2)

## 2021-01-05 MED ORDER — IBUPROFEN 600 MG PO TABS: 600.0000 mg | ORAL_TABLET | Freq: Four times a day (QID) | ORAL | 0 refills | Status: AC

## 2021-01-05 NOTE — Lactation Note (Signed)
This note was copied from a baby's chart. Lactation Consultation Note  Patient Name: Carolyn Chavez [id number] Date: 01/05/2021 Reason for consult: Follow-up assessment Age:32 hours Lactation Rounds: LC to the room for a visit. Mother and baby are positioned in cradle on the left. Mother stated baby is latched but is very sleepy. Encouraged unwrapping the blanket and to turn baby toward Mother with chin more into the breast and ear, shoulder and hip inline. Mother states feeds are going well but her nipple is pinched when baby comes off the breast. Discussed with better positioning this should get better. LC reviewed and encouraged feeding on demand and with cues. If baby is not cueing we encourage hand expression. Reviewed diaper counts for days of life and when to call Peds with questions. Reviewed "understanding Postpartum and Newborn care " booklet at bedside. Reviewed outpatient Lactation number and resources. Reviewed pacifier usage, pumping, and bottles are not encouraged until breastfeeding is established and going well in the first 4 weeks. Mother stated understanding with all teaching and has no further questions. [facility] # remains on board.   Maternal Data Has patient been taught Hand Expression?: Yes Does the patient have breastfeeding experience prior to this delivery?: Yes How long did the patient breastfeed?: 1 year  Feeding Mother's Current Feeding Choice: Breast Milk  LATCH Score Latch: Grasps breast easily, tongue down, lips flanged, rhythmical sucking.  Audible Swallowing: Spontaneous and intermittent (did require stimulation to get started)  Type of Nipple: Everted at rest and after stimulation  Comfort (Breast/Nipple): Soft / non-tender (filling breast)  Hold (Positioning): Assistance needed to correctly position infant at breast and maintain latch.  LATCH Score: 9   Lactation Tools Discussed/Used    Interventions Interventions: Breast feeding basics  reviewed;Adjust position;Support pillows;Position options;Education (Baby was altched well, helped with proper positioning with baby at breast)  Discharge Discharge Education: Engorgement and breast care;Warning signs for feeding baby (encourgaed use of ice) Pump: Personal WIC Program: No  Consult Status Consult Status: Complete    Augustine Leverette D Jerimiah Wolman 01/05/2021, 12:19 PM

## 2021-01-05 NOTE — Progress Notes (Addendum)
Consent signed to patient take placenta home for planting. Placenta given to patient.  Patient discharged with infant. Discharge instructions, prescriptions, and follow up appointments given to and reviewed with patient. Patient verbalized understanding. Will be escorted out by axillary.

## 2021-01-08 ENCOUNTER — Inpatient Hospital Stay: Payer: Medicaid Other

## 2021-01-08 ENCOUNTER — Telehealth: Payer: Self-pay | Admitting: Oncology

## 2021-01-08 NOTE — Telephone Encounter (Signed)
Patient gave birth on Friday and requests to cancel her lab only appointment for this afternoon. She is also scheduled for labs next week on 7/20 and stated she will call back if she is unable to come.

## 2021-01-09 ENCOUNTER — Encounter: Payer: Medicaid Other | Admitting: Obstetrics and Gynecology

## 2021-01-15 ENCOUNTER — Other Ambulatory Visit: Payer: Self-pay

## 2021-01-15 ENCOUNTER — Inpatient Hospital Stay: Payer: Medicaid Other

## 2021-01-15 DIAGNOSIS — E538 Deficiency of other specified B group vitamins: Secondary | ICD-10-CM

## 2021-01-15 DIAGNOSIS — O99113 Other diseases of the blood and blood-forming organs and certain disorders involving the immune mechanism complicating pregnancy, third trimester: Secondary | ICD-10-CM | POA: Diagnosis not present

## 2021-01-15 DIAGNOSIS — Z3A37 37 weeks gestation of pregnancy: Secondary | ICD-10-CM | POA: Diagnosis not present

## 2021-01-15 LAB — CBC WITH DIFFERENTIAL/PLATELET
Abs Immature Granulocytes: 0.02 10*3/uL (ref 0.00–0.07)
Basophils Absolute: 0 10*3/uL (ref 0.0–0.1)
Basophils Relative: 1 %
Eosinophils Absolute: 0.1 10*3/uL (ref 0.0–0.5)
Eosinophils Relative: 2 %
HCT: 44.1 % (ref 36.0–46.0)
Hemoglobin: 14.8 g/dL (ref 12.0–15.0)
Immature Granulocytes: 0 %
Lymphocytes Relative: 26 %
Lymphs Abs: 1.3 10*3/uL (ref 0.7–4.0)
MCH: 31.5 pg (ref 26.0–34.0)
MCHC: 33.6 g/dL (ref 30.0–36.0)
MCV: 93.8 fL (ref 80.0–100.0)
Monocytes Absolute: 0.2 10*3/uL (ref 0.1–1.0)
Monocytes Relative: 4 %
Neutro Abs: 3.4 10*3/uL (ref 1.7–7.7)
Neutrophils Relative %: 67 %
Platelets: 181 10*3/uL (ref 150–400)
RBC: 4.7 MIL/uL (ref 3.87–5.11)
RDW: 14.4 % (ref 11.5–15.5)
WBC: 5 10*3/uL (ref 4.0–10.5)
nRBC: 0 % (ref 0.0–0.2)

## 2021-03-26 ENCOUNTER — Other Ambulatory Visit: Payer: Self-pay

## 2021-03-26 ENCOUNTER — Encounter: Payer: Self-pay | Admitting: Nurse Practitioner

## 2021-03-26 ENCOUNTER — Other Ambulatory Visit: Payer: Medicaid Other

## 2021-03-26 ENCOUNTER — Inpatient Hospital Stay: Payer: Medicaid Other | Admitting: Oncology

## 2021-03-26 ENCOUNTER — Ambulatory Visit
Admission: EM | Admit: 2021-03-26 | Discharge: 2021-03-26 | Disposition: A | Discharge status: Home / Self Care | Payer: Medicaid Other | Attending: Internal Medicine | Admitting: Internal Medicine

## 2021-03-26 DIAGNOSIS — H9201 Otalgia, right ear: Secondary | ICD-10-CM

## 2021-03-26 NOTE — Discharge Instructions (Signed)
I do not see any foreign body in your ear. Please avoid cleaning your ears on a daily basis. If you have any worsening symptoms please return to urgent care to be reevaluated.

## 2021-03-26 NOTE — ED Triage Notes (Signed)
Pt presents today with c/o of possible cotton tip in left ear, denies pain x 2 days.

## 2021-03-28 NOTE — ED Provider Notes (Signed)
[facility]    CSN: [id number] Arrival date & time: 03/26/21  1636      History   Chief Complaint Chief Complaint  Patient presents with   Foreign Body in Ear    right    HPI Carolyn BOHLE is a 30 y.o. female comes to the urgent care with concern for foreign body in the left ear.  Patient was cleaning her ears with a Q-tip and felt that some of the cotton bud did not come out of the ear.  No hearing loss.  No pain in the ear.  She has sensation of a foreign body in the right ear.   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   History of Papanicolaou smear of cervix [id number]; 04/01/2011   neg; neg, ct/gc neg;    Iron deficiency anemia 09/27/2017   Pica    craves flour   Postpartum care following vaginal delivery 12/23/2014   Supervision of high risk pregnancy, antepartum 05/25/2017   Clinic [facility] Prenatal Labs Dating LMP unsure, dating by 9 week [place] Blood type: O/Positive/-- (11/27 1039)  Genetic Screen 1 Screen: Negative, msAFP - declined   Antibody:Negative (11/27 1039) Anatomic [place] IComplete Rubella: 3.55 (11/27 1039) Varicella: Immune GTT Third trimester: 125 RPR: Non Reactive (11/27 1039)  Rhogam n/a HBsAg: Negative (11/27 1039)  TDaP vaccine                        Flu    Thrombocytopenia (HCC)    Thrombocytopenia affecting pregnancy, antepartum (HCC) 09/28/2017   Suspected ITP, will need retest of plt count 1-3 months postpartum    UTI (urinary tract infection)     Patient Active Problem List   Diagnosis Date Noted   Normal labor and delivery 01/05/2021   [redacted] weeks gestation of pregnancy 01/03/2021   Encounter for care or examination of lactating mother 01/03/2021   Thrombocytopenia affecting pregnancy, antepartum (HCC) 10/22/2020   History of thrombocytopenia 06/06/2020   Supervision of high risk pregnancy, antepartum 06/06/2020    Umbilical hernia without obstruction and without gangrene 05/22/2020   Iron deficiency anemia 09/27/2017   Postpartum care following vaginal delivery 12/23/2014    Past Surgical History:  Procedure Laterality Date   COLPOSCOPY      OB History     Gravida  6   Para  6   Term  6   Preterm  0   AB  0   Living  6      SAB  0   IAB  0   Ectopic  0   Multiple  0   Live Births  6            Home Medications    Prior to Admission medications   Medication Sig Start Date End Date Taking? Authorizing Provider  ibuprofen (ADVIL) 600 MG tablet Take 1 tablet (600 mg total) by mouth every 6 (six) hours. 01/05/21   Mirna Mires, CNM  Prenatal Vit-Fe Fumarate-FA (PRENATAL MULTIVITAMIN) TABS tablet Take 1 tablet by mouth daily at 12 noon.     [provider]  SODIUM FLUORIDE 5000 SENSITIVE 1.1-5 % GEL Take by mouth 2 (two) times daily. 12/13/20   [provider]    Family History Family History  Problem Relation Age of Onset  Cancer Maternal Grandfather 60       lung, brain   Hypertension Maternal Grandfather    Skin cancer Maternal Grandfather    Schizophrenia Brother    Hearing loss Daughter        2/16; oldest daughter; low freq loss    Social History Social History   Tobacco Use   Smoking status: Former    Types: Cigarettes    Quit date: 03/22/2014    Years since quitting: 7.0   Smokeless tobacco: Never  Vaping Use   Vaping Use: Never used  Substance Use Topics   Alcohol use: Not Currently   Drug use: No     Allergies   Patient has no known allergies.   Review of Systems Review of Systems  HENT:  Negative for ear discharge, ear pain, hearing loss, sinus pressure and sinus pain.     Physical Exam Triage Vital Signs ED Triage Vitals  Enc Vitals Group     BP 03/26/21 1727 130/79     Pulse Rate 03/26/21 1727 67     Resp 03/26/21 1727 18     Temp 03/26/21 1727 98.5 F (36.9 C)     Temp Source 03/26/21 1727 Oral      SpO2 03/26/21 1727 100 %     Weight --      Height --      Head Circumference --      Peak Flow --      Pain Score 03/26/21 1726 0     Pain Loc --      Pain Edu? --      Excl. in GC? --    No data found.  Updated Vital Signs BP 130/79 (BP Location: Left Arm)   Pulse 67   Temp 98.5 F (36.9 C) (Oral)   Resp 18   LMP 04/11/2020 (Exact Date) Comment: normal  SpO2 100%   Breastfeeding Yes   Visual Acuity Right Eye Distance:   Left Eye Distance:   Bilateral Distance:    Right Eye Near:   Left Eye Near:    Bilateral Near:     Physical Exam HENT:     Right Ear: Tympanic membrane normal.     Left Ear: Tympanic membrane normal.     Ears:     Comments: No foreign body seen in both ears.    UC Treatments / Results  Labs (all labs ordered are listed, but only abnormal results are displayed) Labs Reviewed - No data to display  EKG   Radiology No results found.  Procedures Procedures (including critical care time)  Medications Ordered in UC Medications - No data to display  Initial Impression / Assessment and Plan / UC Course  I have reviewed the triage vital signs and the nursing notes.  Pertinent labs & imaging results that were available during my care of the patient were reviewed by me and considered in my medical decision making (see chart for details).     1.  Foreign body in the right ear: Your evaluation was negative for foreign body in the right ear Reassurance given. Final Clinical Impressions(s) / UC Diagnoses   Final diagnoses:  Otalgia of right ear     Discharge Instructions      I do not see any foreign body in your ear. Please avoid cleaning your ears on a daily basis. If you have any worsening symptoms please return to urgent care to be reevaluated.   ED Prescriptions   None  PDMP not reviewed this encounter.   Merrilee Jansky, MD 03/28/21 1000

## 2023-07-01 ENCOUNTER — Encounter: Payer: Self-pay | Admitting: Nurse Practitioner

## 2023-07-01 NOTE — Telephone Encounter (Signed)
 Error

## 2023-07-07 ENCOUNTER — Encounter: Payer: Self-pay | Admitting: Nurse Practitioner

## 2023-07-07 ENCOUNTER — Other Ambulatory Visit: Payer: Self-pay

## 2023-07-07 ENCOUNTER — Ambulatory Visit
Admission: EM | Admit: 2023-07-07 | Discharge: 2023-07-07 | Disposition: A | Discharge status: Home / Self Care | Payer: Medicaid Other | Attending: Nurse Practitioner | Admitting: Nurse Practitioner

## 2023-07-07 DIAGNOSIS — R079 Chest pain, unspecified: Secondary | ICD-10-CM | POA: Diagnosis not present

## 2023-07-07 DIAGNOSIS — M546 Pain in thoracic spine: Secondary | ICD-10-CM

## 2023-07-07 DIAGNOSIS — R112 Nausea with vomiting, unspecified: Secondary | ICD-10-CM

## 2023-07-07 DIAGNOSIS — R109 Unspecified abdominal pain: Secondary | ICD-10-CM

## 2023-07-07 LAB — POCT URINALYSIS DIP (MANUAL ENTRY)
Bilirubin, UA: NEGATIVE
Blood, UA: NEGATIVE
Glucose, UA: NEGATIVE mg/dL
Leukocytes, UA: NEGATIVE
Nitrite, UA: NEGATIVE
Protein Ur, POC: 30 mg/dL — AB
Spec Grav, UA: 1.02 (ref 1.010–1.025)
Urobilinogen, UA: 4 U/dL — AB
pH, UA: 7 (ref 5.0–8.0)

## 2023-07-07 MED ORDER — ALUM & MAG HYDROXIDE-SIMETH 200-200-20 MG/5ML PO SUSP
30.0000 mL | Freq: Once | ORAL | Status: AC
  Administered 2023-07-07: 30 mL via ORAL

## 2023-07-07 MED ORDER — LIDOCAINE VISCOUS HCL 2 % MT SOLN
15.0000 mL | Freq: Once | OROMUCOSAL | Status: AC
  Administered 2023-07-07: 15 mL via OROMUCOSAL

## 2023-07-07 MED ORDER — OMEPRAZOLE 20 MG PO CPDR: 20.0000 mg | DELAYED_RELEASE_CAPSULE | Freq: Every day | ORAL | 0 refills | Status: AC

## 2023-07-07 NOTE — ED Triage Notes (Signed)
 Pt reports she has had vomiting, chest pain, and upper back pain  x 1 day.

## 2023-07-07 NOTE — Discharge Instructions (Addendum)
 It is likely that your symptoms have been caused by a virus.  As discussed, if you experience worsening chest pain with new symptoms of shortness of breath, difficulty breathing, or other concerns, please follow-up in the emergency department immediately. Take medication as prescribed. If symptoms do not improve with this treatment, please follow-up with your primary care physician for further evaluation. Follow-up as needed.

## 2023-07-07 NOTE — ED Provider Notes (Signed)
 [facility]    CSN: [id number] Arrival date & time: 07/07/23  1923      History   Chief Complaint No chief complaint on file.   HPI Carolyn Chavez is a 33 y.o. female.   The history is provided by the patient.   Patient presents for complaints of vomiting, chest pain, and upper back pain that started over the past 24 hours.  She states vomiting has since stopped.  She continues to experience chest pain that radiates through her upper back.  She also complains of some mild abdominal tenderness.  She denies fever, chills, upper respiratory symptoms, diarrhea, constipation, numbness or tingling of the upper extremities, injury or trauma.  Patient states that she does have a history of a bleeding disorder that presents during pregnancy.  She has not taken any medication for symptoms.  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   History of Papanicolaou smear of cervix [id number]; 04/01/2011   neg; neg, ct/gc neg;    Iron  deficiency anemia 09/27/2017   Pica    craves flour   Postpartum care following vaginal delivery 12/23/2014   Supervision of high risk pregnancy, antepartum 05/25/2017   Clinic [facility] Prenatal Labs Dating LMP unsure, dating by 9 week US  Blood type: O/Positive/-- (11/27 1039)  Genetic Screen 1 Screen: Negative, msAFP - declined   Antibody:Negative (11/27 1039) Anatomic US  IComplete Rubella: 3.55 (11/27 1039) Varicella: Immune GTT Third trimester: 125 RPR: Non Reactive (11/27 1039)  Rhogam n/a HBsAg: Negative (11/27 1039)  TDaP vaccine                        Flu    Thrombocytopenia (HCC)    Thrombocytopenia affecting pregnancy, antepartum (HCC) 09/28/2017   Suspected ITP, will need retest of plt count 1-3 months postpartum    UTI (urinary tract infection)     Patient Active Problem List   Diagnosis Date Noted   Normal labor and delivery  01/05/2021   [redacted] weeks gestation of pregnancy 01/03/2021   Encounter for care or examination of lactating mother 01/03/2021   Thrombocytopenia affecting pregnancy, antepartum (HCC) 10/22/2020   History of thrombocytopenia 06/06/2020   Supervision of high risk pregnancy, antepartum 06/06/2020   Umbilical hernia without obstruction and without gangrene 05/22/2020   Iron  deficiency anemia 09/27/2017   Postpartum care following vaginal delivery 12/23/2014    Past Surgical History:  Procedure Laterality Date   COLPOSCOPY      OB History     Gravida  6   Para  6   Term  6   Preterm  0   AB  0   Living  6      SAB  0   IAB  0   Ectopic  0   Multiple  0   Live Births  6            Home Medications    Prior to Admission medications   Medication Sig Start Date End Date Taking? Authorizing Provider  omeprazole  (PRILOSEC) 20 MG capsule Take 1 capsule (20 mg total) by mouth daily. 07/07/23  Yes Leath-Warren, Etta PARAS, NP  ibuprofen  (ADVIL ) 600 MG tablet Take 1 tablet (600 mg total) by mouth every 6 (six) hours. 01/05/21   Carlin Rollene HERO, CNM  Prenatal Vit-Fe Fumarate-FA (PRENATAL MULTIVITAMIN) TABS  tablet Take 1 tablet by mouth daily at 12 noon.     [provider]  SODIUM FLUORIDE 5000 SENSITIVE 1.1-5 % GEL Take by mouth 2 (two) times daily. 12/13/20   [provider]    Family History Family History  Problem Relation Age of Onset   Cancer Maternal Grandfather 60       lung, brain   Hypertension Maternal Grandfather    Skin cancer Maternal Grandfather    Schizophrenia Brother    Hearing loss Daughter        2/16; oldest daughter; low freq loss    Social History Social History   Tobacco Use   Smoking status: Former    Current packs/day: 0.00    Types: Cigarettes    Quit date: 03/22/2014    Years since quitting: 9.3   Smokeless tobacco: Never  Vaping Use   Vaping status: Never Used  Substance Use Topics   Alcohol use: Not Currently    Drug use: No     Allergies   Patient has no known allergies.   Review of Systems Review of Systems Per HPI  Physical Exam Triage Vital Signs ED Triage Vitals  Encounter Vitals Group     BP 07/07/23 1938 111/73     Systolic BP Percentile --      Diastolic BP Percentile --      Pulse Rate 07/07/23 1938 70     Resp 07/07/23 1938 20     Temp 07/07/23 1938 98.1 F (36.7 C)     Temp Source 07/07/23 1938 Oral     SpO2 07/07/23 1938 98 %     Weight --      Height --      Head Circumference --      Peak Flow --      Pain Score 07/07/23 1936 0     Pain Loc --      Pain Education --      Exclude from Growth Chart --    No data found.  Updated Vital Signs BP 111/73 (BP Location: Right Arm)   Pulse 70   Temp 98.1 F (36.7 C) (Oral)   Resp 20   LMP 06/14/2023 (Approximate) Comment: pt is unsure last MP date  SpO2 98%   Visual Acuity Right Eye Distance:   Left Eye Distance:   Bilateral Distance:    Right Eye Near:   Left Eye Near:    Bilateral Near:     Physical Exam Vitals and nursing note reviewed.  Constitutional:      General: She is not in acute distress.    Appearance: Normal appearance.  HENT:     Head: Normocephalic.  Eyes:     Extraocular Movements: Extraocular movements intact.     Pupils: Pupils are equal, round, and reactive to light.  Cardiovascular:     Rate and Rhythm: Normal rate and regular rhythm.     Pulses: Normal pulses.     Heart sounds: Normal heart sounds.  Pulmonary:     Effort: Pulmonary effort is normal.     Breath sounds: Normal breath sounds.  Abdominal:     General: Bowel sounds are normal.     Palpations: Abdomen is soft.     Tenderness: There is abdominal tenderness in the epigastric area and periumbilical area.     Comments: GI cocktail administered, patient reports improvement of abdominal pain after administration.   Musculoskeletal:     Cervical back: Normal range of motion.  Lymphadenopathy:  Cervical: No  cervical adenopathy.  Skin:    General: Skin is warm and dry.  Neurological:     General: No focal deficit present.     Mental Status: She is alert and oriented to person, place, and time.  Psychiatric:        Mood and Affect: Mood normal.        Behavior: Behavior normal.    UC Treatments / Results  Labs (all labs ordered are listed, but only abnormal results are displayed) Labs Reviewed  POCT URINALYSIS DIP (MANUAL ENTRY) - Abnormal; Notable for the following components:      Result Value   Ketones, POC UA trace (5) (*)    Protein Ur, POC =30 (*)    Urobilinogen, UA 4.0 (*)    All other components within normal limits    EKG: Normal sinus rhythm without ectopy, no STEMI.  No other comparisons available.   Radiology No results found.  Procedures Procedures (including critical care time)  Medications Ordered in UC Medications  alum & mag hydroxide-simeth (MAALOX/MYLANTA) 200-200-20 MG/5ML suspension 30 mL (30 mLs Oral Given 07/07/23 1958)  lidocaine  (XYLOCAINE ) 2 % viscous mouth solution 15 mL (15 mLs Mouth/Throat Given 07/07/23 1958)    Initial Impression / Assessment and Plan / UC Course  I have reviewed the triage vital signs and the nursing notes.  Pertinent labs & imaging results that were available during my care of the patient were reviewed by me and considered in my medical decision making (see chart for details).  On exam, lung sounds are clear throughout, room air sats at 98%.  She does have mild periumbilical and epigastric tenderness.  Vomiting has since improved.  Difficult to determine the cause of the patient's abdominal pain with nausea and vomiting, symptoms have since improved.  GI cocktail was administered with some relief of the patient's symptoms.  She continues to experience chest pain that she states radiates to the upper back.  Given the underlying history of bleeding disorder, discussed with patient that this provider cannot rule out PE given her  symptoms.  EKG shows normal sinus rhythm.  Advised patient to continue to monitor symptoms for worsening, advised patient to follow-up in the emergency department immediately if she experiences worsening pain, difficulty breathing, or shortness of breath.  Supportive care recommendations were provided and discussed with the patient.  Patient is in agreement with this plan of care and verbalizes understanding.  All questions were answered.  Patient stable for discharge.  Final Clinical Impressions(s) / UC Diagnoses   Final diagnoses:  Abdominal pain, unspecified abdominal location  Nausea and vomiting, unspecified vomiting type  Chest pain, unspecified type  Midline thoracic back pain, unspecified chronicity     Discharge Instructions      It is likely that your symptoms have been caused by a virus.  As discussed, if you experience worsening chest pain with new symptoms of shortness of breath, difficulty breathing, or other concerns, please follow-up in the emergency department immediately. Take medication as prescribed. If symptoms do not improve with this treatment, please follow-up with your primary care physician for further evaluation. Follow-up as needed.     ED Prescriptions     Medication Sig Dispense Auth. Provider   omeprazole  (PRILOSEC) 20 MG capsule Take 1 capsule (20 mg total) by mouth daily. 30 capsule Leath-Warren, Etta PARAS, NP      PDMP not reviewed this encounter.   Gilmer Etta PARAS, NP 07/08/23 1945

## 2023-07-08 ENCOUNTER — Telehealth (HOSPITAL_COMMUNITY): Payer: Self-pay

## 2023-07-08 NOTE — Telephone Encounter (Signed)
 Pt LVM requesting call back.  Attempted to reach patient x1. VM full.

## 2024-06-16 ENCOUNTER — Other Ambulatory Visit: Payer: Self-pay

## 2024-06-16 DIAGNOSIS — G43009 Migraine without aura, not intractable, without status migrainosus: Secondary | ICD-10-CM | POA: Insufficient documentation

## 2024-06-16 NOTE — ED Triage Notes (Signed)
 Pt ambulatory to triage room with steady gait, reports she has been dealing with a migraine for the past 4 weeks. Pt reports she has been taking BC and Advil . Pt reports for the past 4 days she can hear her heart beat on her right ear. Pt denies any other symptom at present. Pt talks in complete sentences no respiratory distress noted

## 2024-06-17 ENCOUNTER — Emergency Department
Admission: EM | Admit: 2024-06-17 | Discharge: 2024-06-17 | Disposition: A | Attending: Emergency Medicine | Admitting: Emergency Medicine

## 2024-06-17 ENCOUNTER — Emergency Department

## 2024-06-17 DIAGNOSIS — G43009 Migraine without aura, not intractable, without status migrainosus: Secondary | ICD-10-CM

## 2024-06-17 LAB — CBC WITH DIFFERENTIAL/PLATELET
Abs Immature Granulocytes: 0.02 K/uL (ref 0.00–0.07)
Basophils Absolute: 0.1 K/uL (ref 0.0–0.1)
Basophils Relative: 1 %
Eosinophils Absolute: 0.2 K/uL (ref 0.0–0.5)
Eosinophils Relative: 3 %
HCT: 31.4 % — ABNORMAL LOW (ref 36.0–46.0)
Hemoglobin: 9.5 g/dL — ABNORMAL LOW (ref 12.0–15.0)
Immature Granulocytes: 0 %
Lymphocytes Relative: 35 %
Lymphs Abs: 2.5 K/uL (ref 0.7–4.0)
MCH: 23.5 pg — ABNORMAL LOW (ref 26.0–34.0)
MCHC: 30.3 g/dL (ref 30.0–36.0)
MCV: 77.5 fL — ABNORMAL LOW (ref 80.0–100.0)
Monocytes Absolute: 0.4 K/uL (ref 0.1–1.0)
Monocytes Relative: 6 %
Neutro Abs: 3.8 K/uL (ref 1.7–7.7)
Neutrophils Relative %: 55 %
Platelets: 181 K/uL (ref 150–400)
RBC: 4.05 MIL/uL (ref 3.87–5.11)
RDW: 19 % — ABNORMAL HIGH (ref 11.5–15.5)
Smear Review: NORMAL
WBC: 7 K/uL (ref 4.0–10.5)
nRBC: 0 % (ref 0.0–0.2)

## 2024-06-17 LAB — BASIC METABOLIC PANEL WITH GFR
Anion gap: 8 (ref 5–15)
BUN: 8 mg/dL (ref 6–20)
CO2: 24 mmol/L (ref 22–32)
Calcium: 8.5 mg/dL — ABNORMAL LOW (ref 8.9–10.3)
Chloride: 104 mmol/L (ref 98–111)
Creatinine, Ser: 0.62 mg/dL (ref 0.44–1.00)
GFR, Estimated: >60 mL/min
Glucose, Bld: 99 mg/dL (ref 70–99)
Potassium: 3.8 mmol/L (ref 3.5–5.1)
Sodium: 136 mmol/L (ref 135–145)

## 2024-06-17 LAB — POC URINE PREG, ED: Preg Test, Ur: NEGATIVE

## 2024-06-17 MED ORDER — PROCHLORPERAZINE EDISYLATE 10 MG/2ML IJ SOLN
10.0000 mg | Freq: Once | INTRAMUSCULAR | Status: AC
  Administered 2024-06-17: 10 mg via INTRAVENOUS
  Filled 2024-06-17: qty 2

## 2024-06-17 MED ORDER — DIPHENHYDRAMINE HCL 50 MG/ML IJ SOLN
25.0000 mg | Freq: Once | INTRAMUSCULAR | Status: AC
  Administered 2024-06-17: 25 mg via INTRAVENOUS
  Filled 2024-06-17: qty 1

## 2024-06-17 MED ORDER — ACETAMINOPHEN 500 MG PO TABS
1000.0000 mg | ORAL_TABLET | Freq: Once | ORAL | Status: AC
  Administered 2024-06-17: 1000 mg via ORAL
  Filled 2024-06-17: qty 2

## 2024-06-17 MED ORDER — KETOROLAC TROMETHAMINE 15 MG/ML IJ SOLN
15.0000 mg | Freq: Once | INTRAMUSCULAR | Status: AC
  Administered 2024-06-17: 15 mg via INTRAVENOUS
  Filled 2024-06-17: qty 1

## 2024-06-17 MED ORDER — IOHEXOL 350 MG/ML SOLN
75.0000 mL | Freq: Once | INTRAVENOUS | Status: AC | PRN
  Administered 2024-06-17: 75 mL via INTRAVENOUS

## 2024-06-17 NOTE — ED Provider Notes (Signed)
 "  [facility] Provider Note    Event Date/Time   First MD Initiated Contact with Patient 06/17/24 0115     (approximate)   History   Headache   HPI  Carolyn Chavez is a 33 y.o. female   Past medical history of no significant past medical history who presents to Emergency Department with Gradual onset left-sided temporal headache with photophobia, on and off waxing and waning for the last couple of weeks.  No history of migraine headaches.  No visual changes, neurologic changes, temporal tenderness, or any obvious inciting alleviating or exacerbating patterns.   No trauma.   No fevers chills or neck stiffness.   No other acute medical complaints.    Independent Historian contributed to assessment above: Husband at bedside corroborates information as above  Physical Exam   Triage Vital Signs: ED Triage Vitals  Encounter Vitals Group     BP 06/16/24 2147 (!) 141/84     Girls Systolic BP Percentile --      Girls Diastolic BP Percentile --      Boys Systolic BP Percentile --      Boys Diastolic BP Percentile --      Pulse Rate 06/16/24 2147 69     Resp 06/16/24 2147 16     Temp 06/16/24 2147 97.8 F (36.6 C)     Temp Source 06/16/24 2147 Oral     SpO2 06/16/24 2147 98 %     Weight 06/16/24 2148 130 lb (59 kg)     Height 06/16/24 2148 5' 6 (1.676 m)     Head Circumference --      Peak Flow --      Pain Score 06/16/24 2148 0     Pain Loc --      Pain Education --      Exclude from Growth Chart --     Most recent vital signs: Vitals:   06/17/24 0248 06/17/24 0405  BP: 131/78 135/71  Pulse: 70 73  Resp: 18 18  Temp: (!) 97.4 F (36.3 C) 97.7 F (36.5 C)  SpO2: 97% 98%    General: Awake, no distress.  CV:  Good peripheral perfusion.  Resp:  Normal effort.  Abd:  No distention.  Other:  Awake alert comfortable appearing.  No temporal tenderness to palpation.  Extraocular moods are intact.  Neck supple full range of motion.  Motor  sensor intact throughout.  Ambulating with steady gait.   ED Results / Procedures / Treatments   Labs (all labs ordered are listed, but only abnormal results are displayed) Labs Reviewed  BASIC METABOLIC PANEL WITH GFR - Abnormal; Notable for the following components:      Result Value   Calcium 8.5 (*)    All other components within normal limits  CBC WITH DIFFERENTIAL/PLATELET - Abnormal; Notable for the following components:   Hemoglobin 9.5 (*)    HCT 31.4 (*)    MCV 77.5 (*)    MCH 23.5 (*)    RDW 19.0 (*)    All other components within normal limits  POC URINE PREG, ED     I ordered and reviewed the above labs they are notable for cell counts electrolytes largely unremarkable, anemia with a small degree of anemia noted in labs in the past.      RADIOLOGY I independently reviewed and interpreted CT of the head and see no evidence bleed or midline shift I also reviewed radiologist's formal read.   PROCEDURES:  Critical Care performed: No  Procedures   MEDICATIONS ORDERED IN ED: Medications  ketorolac  (TORADOL ) 15 MG/ML injection 15 mg (15 mg Intravenous Given 06/17/24 0250)  prochlorperazine  (COMPAZINE ) injection 10 mg (10 mg Intravenous Given 06/17/24 0251)  diphenhydrAMINE  (BENADRYL ) injection 25 mg (25 mg Intravenous Given 06/17/24 0251)  acetaminophen  (TYLENOL ) tablet 1,000 mg (1,000 mg Oral Given 06/17/24 0249)  iohexol  (OMNIPAQUE ) 350 MG/ML injection 75 mL (75 mLs Intravenous Contrast Given 06/17/24 0233)     IMPRESSION / MDM / ASSESSMENT AND PLAN / ED COURSE  I reviewed the triage vital signs and the nursing notes.                                Patient's presentation is most consistent with acute presentation with potential threat to life or bodily function.  Differential diagnosis includes, but is not limited to, migraine headache, tension headache, cluster headache, ICH, aneurysm, temporal arteritis, dural sinus thrombosis    MDM:      Most likely migraine headache unilateral with photophobia.  Gradual onset on and off with no obvious inciting event, no thunderclap headache, no symptoms or signs suggestive of more sinister etiologies of meningitis, stroke, colitis, thromboses.  Given migraine cocktail with improvement.  Given new onset of headache, gotten a CT angiogram of the head which shows no aneurysm or bleed.  Highly unlikely to represent a SAH, defer lumbar puncture.    I considered hospitalization for admission or observation however given improvement in low clinical suspicion for more life-threatening or serious causes of headache as explained above, plan will be for discharge home PMD follow-up, return precaution discussed         FINAL CLINICAL IMPRESSION(S) / ED DIAGNOSES   Final diagnoses:  Migraine without aura and without status migrainosus, not intractable     Rx / DC Orders   ED Discharge Orders     None        Note:  This document was prepared using Dragon voice recognition software and may include unintentional dictation errors.    Cyrena Mylar, MD 06/17/24 [phone]  "

## 2024-06-17 NOTE — Discharge Instructions (Signed)
 Fortunately your evaluation Emergency Department did not show any emergency conditions like bleeding inside the brain, masses inside of the brain, or any blood vessel problems that account for more dangerous conditions leading to your headache.  Take acetaminophen  650 mg and ibuprofen  400 mg every 6 hours for pain.  Take with food.  If you continue to have recurrent headaches you can call headache specialist Dr. Maree for an appointment.  Thank you for choosing us  for your health care today!  Please see your primary doctor this week for a follow up appointment.   If you have any new, worsening, or unexpected symptoms call your doctor right away or come back to the emergency department for reevaluation.  It was my pleasure to care for you today.   Ginnie EDISON Cyrena, MD
